# Patient Record
Sex: Female | Born: 1945 | Race: White | Hispanic: No | Marital: Single | Smoking: Former smoker
Health system: Southern US, Community
[De-identification: ages and names within clinical notes are randomized; demographics above are authoritative.]

## PROBLEM LIST (undated history)

## (undated) DIAGNOSIS — K760 Fatty (change of) liver, not elsewhere classified: Secondary | ICD-10-CM

## (undated) DIAGNOSIS — J45909 Unspecified asthma, uncomplicated: Secondary | ICD-10-CM

## (undated) DIAGNOSIS — G4733 Obstructive sleep apnea (adult) (pediatric): Secondary | ICD-10-CM

## (undated) DIAGNOSIS — E785 Hyperlipidemia, unspecified: Secondary | ICD-10-CM

## (undated) DIAGNOSIS — J479 Bronchiectasis, uncomplicated: Secondary | ICD-10-CM

## (undated) DIAGNOSIS — I10 Essential (primary) hypertension: Secondary | ICD-10-CM

## (undated) DIAGNOSIS — K5190 Ulcerative colitis, unspecified, without complications: Secondary | ICD-10-CM

## (undated) HISTORY — PX: ABDOMINAL PERINEAL BOWEL RESECTION: SHX1111

## (undated) HISTORY — PX: ABDOMINAL HERNIA REPAIR: SHX539

## (undated) HISTORY — DX: Fatty (change of) liver, not elsewhere classified: K76.0

## (undated) HISTORY — DX: Ulcerative colitis, unspecified, without complications: K51.90

## (undated) HISTORY — PX: TONSILLECTOMY: SUR1361

## (undated) HISTORY — DX: Essential (primary) hypertension: I10

## (undated) HISTORY — DX: Bronchiectasis, uncomplicated: J47.9

## (undated) HISTORY — PX: UTERINE FIBROID SURGERY: SHX826

## (undated) HISTORY — DX: Obstructive sleep apnea (adult) (pediatric): G47.33

## (undated) HISTORY — DX: Hyperlipidemia, unspecified: E78.5

## (undated) HISTORY — DX: Unspecified asthma, uncomplicated: J45.909

---

## 2007-03-04 DIAGNOSIS — K760 Fatty (change of) liver, not elsewhere classified: Secondary | ICD-10-CM | POA: Insufficient documentation

## 2009-06-19 DEATH — deceased

## 2009-10-19 DEATH — deceased

## 2014-07-09 DIAGNOSIS — Z8719 Personal history of other diseases of the digestive system: Secondary | ICD-10-CM | POA: Insufficient documentation

## 2014-07-09 DIAGNOSIS — M810 Age-related osteoporosis without current pathological fracture: Secondary | ICD-10-CM | POA: Insufficient documentation

## 2014-07-09 DIAGNOSIS — D751 Secondary polycythemia: Secondary | ICD-10-CM | POA: Insufficient documentation

## 2014-07-09 DIAGNOSIS — N200 Calculus of kidney: Secondary | ICD-10-CM | POA: Insufficient documentation

## 2014-07-09 DIAGNOSIS — F341 Dysthymic disorder: Secondary | ICD-10-CM | POA: Insufficient documentation

## 2014-07-09 DIAGNOSIS — G4733 Obstructive sleep apnea (adult) (pediatric): Secondary | ICD-10-CM | POA: Insufficient documentation

## 2014-07-09 DIAGNOSIS — E039 Hypothyroidism, unspecified: Secondary | ICD-10-CM | POA: Insufficient documentation

## 2014-07-09 DIAGNOSIS — M722 Plantar fascial fibromatosis: Secondary | ICD-10-CM | POA: Insufficient documentation

## 2014-07-24 DIAGNOSIS — J4520 Mild intermittent asthma, uncomplicated: Secondary | ICD-10-CM | POA: Insufficient documentation

## 2015-02-21 DIAGNOSIS — F1021 Alcohol dependence, in remission: Secondary | ICD-10-CM | POA: Insufficient documentation

## 2015-06-11 DIAGNOSIS — F419 Anxiety disorder, unspecified: Secondary | ICD-10-CM | POA: Insufficient documentation

## 2015-09-05 DIAGNOSIS — F1021 Alcohol dependence, in remission: Secondary | ICD-10-CM | POA: Insufficient documentation

## 2015-09-05 DIAGNOSIS — F900 Attention-deficit hyperactivity disorder, predominantly inattentive type: Secondary | ICD-10-CM | POA: Insufficient documentation

## 2016-11-26 DIAGNOSIS — G252 Other specified forms of tremor: Secondary | ICD-10-CM | POA: Insufficient documentation

## 2017-01-15 DIAGNOSIS — Z6841 Body Mass Index (BMI) 40.0 and over, adult: Secondary | ICD-10-CM | POA: Insufficient documentation

## 2017-07-28 DIAGNOSIS — H2513 Age-related nuclear cataract, bilateral: Secondary | ICD-10-CM | POA: Insufficient documentation

## 2017-07-28 DIAGNOSIS — H268 Other specified cataract: Secondary | ICD-10-CM | POA: Insufficient documentation

## 2017-09-21 DIAGNOSIS — K219 Gastro-esophageal reflux disease without esophagitis: Secondary | ICD-10-CM | POA: Insufficient documentation

## 2017-09-21 DIAGNOSIS — E7800 Pure hypercholesterolemia, unspecified: Secondary | ICD-10-CM | POA: Insufficient documentation

## 2018-03-11 ENCOUNTER — Encounter: Payer: Self-pay | Admitting: Internal Medicine

## 2018-03-11 ENCOUNTER — Ambulatory Visit (INDEPENDENT_AMBULATORY_CARE_PROVIDER_SITE_OTHER): Payer: Medicare Other | Admitting: Internal Medicine

## 2018-03-11 ENCOUNTER — Ambulatory Visit (INDEPENDENT_AMBULATORY_CARE_PROVIDER_SITE_OTHER)
Admission: RE | Admit: 2018-03-11 | Discharge: 2018-03-11 | Disposition: A | Discharge status: Home / Self Care | Payer: Medicare Other | Source: Ambulatory Visit | Attending: Internal Medicine | Admitting: Internal Medicine

## 2018-03-11 VITALS — BP 122/80 | HR 75 | Ht 59.0 in | Wt 193.0 lb

## 2018-03-11 DIAGNOSIS — J479 Bronchiectasis, uncomplicated: Secondary | ICD-10-CM

## 2018-03-11 MED ORDER — AMOXICILLIN-POT CLAVULANATE 875-125 MG PO TABS: 1.0000 | ORAL_TABLET | Freq: Two times a day (BID) | ORAL | 0 refills | Status: DC

## 2018-03-11 MED ORDER — AMOXICILLIN-POT CLAVULANATE 875-125 MG PO TABS: 1.0000 | ORAL_TABLET | Freq: Two times a day (BID) | ORAL | 0 refills | Status: AC

## 2018-03-11 MED ORDER — BUDESONIDE-FORMOTEROL FUMARATE 80-4.5 MCG/ACT IN AERO: 2.0000 | INHALATION_SPRAY | Freq: Two times a day (BID) | RESPIRATORY_TRACT | 11 refills | Status: DC

## 2018-03-11 NOTE — Assessment & Plan Note (Addendum)
03/11/2018  After extensive coaching inhaler device  effectiveness =    75% so try symbicort 80 2bid and stop fosfamax  DDX of  difficult airways management almost all start with A and  include Adherence, Ace Inhibitors, Acid Reflux, Active Sinus Disease, Alpha 1 Antitripsin deficiency, Anxiety masquerading as Airways dz,  ABPA,  Allergy(esp in young), Aspiration (esp in elderly), Adverse effects of meds,  Active smokers, A bunch of PE's (a small clot burden can't cause this syndrome unless there is already severe underlying pulm or vascular dz with poor reserve) plus two Bs  = Bronchiectasis and Beta blocker use..and one C= CHF   Adherence is always the initial "prime suspect" and is a multilayered concern that requires a "trust but verify" approach in every patient - starting with knowing how to use medications, especially inhalers, correctly, keeping up with refills and understanding the fundamental difference between maintenance and prns vs those medications only taken for a very short course and then stopped and not refilled.  - see hfa teaching  ? Active sinusitis > augmentin x 10 d and low threshold for f/u sinus ct in the setting of documented bronchiectasis   ? Acid (or non-acid) GERD > always difficult to exclude as up to 75% of pts in some series report no assoc GI/ Heartburn symptoms and she has active HB on fosfamax > rec max (24h)  acid suppression and diet restrictions/ reviewed and instructions given in writing.   ? Adverse effects of meds >  Stop fosfamax/ stop DPI (advair and replace with symb 802bid)  ? Allergy / immune def >  Use low dose symb for now, check immunos on return   ? Alpha one def > screen on return   Bronchiectasis vs pna L post basal segment > augmentin x 10 days and f/u cxr on return   ? CHF > note cm on cxr ? Cardiac asthma component > check bnp on return    Total time devoted to counseling  > 50 % of initial 60 min office visit:  review case with pt/  discussion of options/alternatives/ personally creating written customized instructions  in presence of pt  then going over those specific  Instructions directly with the pt including how to use all of the meds but in particular covering each new medication in detail and the difference between the maintenance= "automatic" meds and the prns using an action plan format for the latter (If this problem/symptom => do that organization reading Left to right).  Please see AVS from this visit for a full list of these instructions which I personally wrote for this pt and  are unique to this visit.   See device teaching which extended face to face time for this visit

## 2018-03-11 NOTE — Patient Instructions (Addendum)
Stop advair and alendronate for now  Augmentin 875 mg take one pill twice daily  X 10 days - take at breakfast and supper with large glass of water.  It would help reduce the usual side effects (diarrhea and yeast infections) if you ate cultured yogurt at lunch.   Symbicort 80 Take 2 puffs first thing in am and then another 2 puffs about 12 hours later.    Work on inhaler technique:  relax and gently blow all the way out then take a nice smooth deep breath back in, triggering the inhaler at same time you start breathing in.  Hold for up to 5 seconds if you can. Blow out thru nose. Rinse and gargle with water when done     Change Omeprazole to take  20 mg x 30- 60 min before your first and last meals of the day   GERD (REFLUX)  is an extremely common cause of respiratory symptoms just like yours , many times with no obvious heartburn at all.    It can be treated with medication, but also with lifestyle changes including elevation of the head of your bed (ideally with 6 inch  bed blocks),  Smoking cessation, avoidance of late meals, excessive alcohol, and avoid fatty foods, chocolate, peppermint, colas, red wine, and acidic juices such as orange juice.  NO MINT OR MENTHOL PRODUCTS SO NO COUGH DROPS   USE SUGARLESS CANDY INSTEAD (Jolley ranchers or Stover's or Life Savers) or even ice chips will also do - the key is to swallow to prevent all throat clearing. NO OIL BASED VITAMINS - use powdered substitutes.    For cough/ congestion  > mucinex or mucinex dm up  To 1200 mg every 12 hours as needed   Please schedule a follow up office visit in 2 weeks to see one our NPS, sooner if needed  with all medications /inhalers/ solutions in hand so we can verify exactly what you are taking. This includes all medications from all doctors and over the counters - set up for pfts on return to see me in 6 weeks from now  - also needs f/u cxr and Ig's and alpha one screen

## 2018-03-11 NOTE — Progress Notes (Signed)
Subjective:     Patient ID: Adriana White, female   DOB: [birth year],    MRN: [id number]  HPI  76 yowf  From [place] Quit smoking in 1986 when first and last baby born with baseline of 160 then around  Late  1990s  noted cough and eval by [facility] pulmonologist and eventually dx'd bronchiectasis placed  On advair seemed to help symtoms of sob/ cough initially but worse since around 12/2017 so selfreferred to pulmonary clinic 03/11/2018      03/11/2018 1st [facility] office visit/ Ashle Stief   Chief Complaint  Patient presents with  . Pulmonary Consult    Self referral. She states dxed with bronchiectasis approx 20 yrs ago. She c/o cough with tan sputum and increased SOB over the past 2 months. She states she gets SOB walking 1/2 a block.   pt with bronchiectasis worse cough x 2 months esp hs  With thick brown mucus    s assoc with nasal congestion and overt HB on fosfamax and ppi  but  no cp and never had hemoptysis with flares in past  Sob walking dog esp hills @ moderate pace   No obvious day to day or daytime variability or assoc  mucus plugs or   chest tightness, subjective wheeze  . No unusual exposure hx or h/o childhood pna/ asthma or knowledge of premature birth.    Also denies any obvious fluctuation of symptoms with weather or environmental changes or other aggravating or alleviating factors except as outlined above   Current Allergies, Complete Past Medical History, Past Surgical History, Family History, and Social History were reviewed in [employer] record.  ROS  The following are not active complaints unless bolded Hoarseness, sore throat, dysphagia, dental problems, itching, sneezing,  nasal congestion or discharge of excess mucus or purulent secretions, ear ache,   fever, chills, sweats, unintended wt loss or wt gain, classically pleuritic or exertional cp,  orthopnea pnd or arm/hand swelling  or leg swelling, presyncope, palpitations, abdominal pain,  anorexia, nausea, vomiting, diarrhea  or change in bowel habits or change in bladder habits, change in stools or change in urine, dysuria, hematuria,  rash, arthralgias, visual complaints, headache, numbness, weakness or ataxia or problems with walking or coordination,  change in mood or  memory.        Current Meds  Medication Sig  . ARIPiprazole (ABILIFY) 2 MG tablet Take 0.5 tablets by mouth daily.  Marland Kitchen aspirin 81 MG chewable tablet Chew 1 tablet by mouth daily.  Marland Kitchen atorvastatin (LIPITOR) 40 MG tablet Take 1 tablet by mouth daily.  . Biotin 1000 MCG tablet Take 1 tablet by mouth daily.  . cetirizine (ZYRTEC ALLERGY) 10 MG tablet Take 10 mg by mouth daily.  . clonazePAM (KLONOPIN) 0.5 MG tablet Take 1 tablet by mouth daily as needed.  . DULoxetine (CYMBALTA) 60 MG capsule Take 1 capsule by mouth daily.  Marland Kitchen levothyroxine (SYNTHROID, LEVOTHROID) 25 MCG tablet Take 1 tablet by mouth daily.  Marland Kitchen losartan (COZAAR) 25 MG tablet Take 1 tablet by mouth daily.  Marland Kitchen omeprazole (PRILOSEC) 20 MG capsule Take 2 tablets by mouth daily.  . traZODone (DESYREL) 100 MG tablet Take 1 tablet by mouth at bedtime.  Marland Kitchen   alendronate (FOSAMAX) 70 MG tablet Take 1 tablet by mouth daily. Pt states she was prescribed this to take daily  . [  Fluticasone-Salmeterol (ADVAIR) 100-50 MCG/DOSE AEPB Inhale 1 puff into the lungs 2 (two) times daily.  Review of Systems     Objective:   Physical Exam    amb wf rattling cough and hoarsness  Wt Readings from Last 3 Encounters:  03/11/18 193 lb (87.5 kg)     Vital signs reviewed - Note on arrival 02 sats  93% on on RA   HEENT: nl dentition, turbinates bilaterally, and oropharynx. Nl external ear canals without cough reflex   NECK :  without JVD/Nodes/TM/ nl carotid upstrokes bilaterally   LUNGS: no acc muscle use,  Nl contour chest with junky pan exp rhonchi  bilaterally with cough on exp maneuvers   CV:  RRR  no s3 or murmur or increase in P2, and no  edema   ABD:  soft and nontender with nl inspiratory excursion in the supine position. No bruits or organomegaly appreciated, bowel sounds nl  MS:  Nl gait/ ext warm without deformities, calf tenderness, cyanosis or clubbing No obvious joint restrictions   SKIN: warm and dry without lesions    NEURO:  alert, approp, nl sensorium with  no motor or cerebellar deficits apparent.          CXR PA and Lateral:   03/11/2018 :    I personally reviewed images and agree with radiology impression as follows:   Posterior left basilar patchy airspace disease. Followup PA and lateral chest X-ray is recommended in 3-4 weeks following trial of antibiotic therapy to ensure resolution and exclude underlying malignancy.  Cardiomegaly without decompensation       Assessment:

## 2018-03-12 ENCOUNTER — Encounter: Payer: Self-pay | Admitting: Internal Medicine

## 2018-03-15 NOTE — Progress Notes (Signed)
Spoke with pt and notified of results per Dr. Wert. Pt verbalized understanding and denied any questions. 

## 2018-03-16 ENCOUNTER — Telehealth: Payer: Self-pay | Admitting: Internal Medicine

## 2018-03-16 NOTE — Telephone Encounter (Signed)
Spoke with patient. She was concerned about babysitting since she was diagnosed with PNA last week. She has been on antibiotics since 5/24. She stated that she currently feels well and was able to babysit yesterday with no problems.   Advised patient that the babysitting would be up to her as long as she felt ok to do so. She verbalized understanding. Nothing else needed at time of call.

## 2018-03-24 ENCOUNTER — Ambulatory Visit: Payer: Medicare Other | Admitting: Adult Health

## 2018-03-31 ENCOUNTER — Ambulatory Visit (INDEPENDENT_AMBULATORY_CARE_PROVIDER_SITE_OTHER): Payer: Medicare Other | Admitting: Pulmonary Disease

## 2018-03-31 ENCOUNTER — Ambulatory Visit (INDEPENDENT_AMBULATORY_CARE_PROVIDER_SITE_OTHER)
Admission: RE | Admit: 2018-03-31 | Discharge: 2018-03-31 | Disposition: A | Discharge status: Home / Self Care | Payer: Medicare Other | Source: Ambulatory Visit | Attending: Pulmonary Disease | Admitting: Pulmonary Disease

## 2018-03-31 ENCOUNTER — Other Ambulatory Visit: Payer: Self-pay | Admitting: Pulmonary Disease

## 2018-03-31 ENCOUNTER — Encounter: Payer: Self-pay | Admitting: Pulmonary Disease

## 2018-03-31 VITALS — BP 116/80 | HR 91 | Ht 59.0 in | Wt 189.8 lb

## 2018-03-31 DIAGNOSIS — J479 Bronchiectasis, uncomplicated: Secondary | ICD-10-CM | POA: Diagnosis not present

## 2018-03-31 DIAGNOSIS — B370 Candidal stomatitis: Secondary | ICD-10-CM | POA: Diagnosis not present

## 2018-03-31 MED ORDER — FLUCONAZOLE 100 MG PO TABS: 100.0000 mg | ORAL_TABLET | Freq: Every day | ORAL | 0 refills | Status: AC

## 2018-03-31 MED ORDER — FLUTTER DEVI: 0 refills | Status: AC

## 2018-03-31 NOTE — Progress Notes (Signed)
Discussed with patient at office visit today.   Wyn Quaker FNP

## 2018-03-31 NOTE — Progress Notes (Signed)
@Patient  ID: Adriana White, female    DOB: Mar 29, 1946, 72 y.o.   MRN: [id number]  Chief Complaint  Patient presents with  . Follow-up    States she feels better no futher concerns voiced.     Referring provider: Derinda Sis, *  HPI:  42 yowf  From [place] Quit smoking in 1986 when first and last baby born with baseline of 160 then around  Late  1990s  noted cough and eval by [facility] pulmonologist and eventually dx'd bronchiectasis placed  On advair seemed to help symtoms of sob/ cough initially but worse since around 12/2017 so selfreferred to pulmonary clinic 03/11/2018     Recent [facility] Encounters:   03/11/2018 1st [facility] office visit/ Wert       Chief Complaint  Patient presents with  . Pulmonary Consult    Self referral. She states dxed with bronchiectasis approx 20 yrs ago. She c/o cough with tan sputum and increased SOB over the past 2 months. She states she gets SOB walking 1/2 a block.   pt with bronchiectasis worse cough x 2 months esp hs  With thick brown mucus    s assoc with nasal congestion and overt HB on fosfamax and ppi  but  no cp and never had hemoptysis with flares in past  Sob walking dog esp hills @ moderate pace   No obvious day to day or daytime variability or assoc  mucus plugs or   chest tightness, subjective wheeze  . No unusual exposure hx or h/o childhood pna/ asthma or knowledge of premature birth.     Tests:   Imaging:  03/11/2018-chest x-ray- posterior left basilar patchy airspace disease, follow-up chest x-ray in 3 to 4 weeks following antibiotic therapy  Cardiac:   Labs:   Micro:   Chart Review:    03/31/18 OV  Pleasant 72 year old patient presenting today for follow-up visit.  Patient's chest x-ray today shows slight improvement in that left lower lobe patchy airspace disease.  After Augmentin.  Patient reporting that she feels very good not having any complaints.  No fevers.  And clinically feels  that she is improved.   Patient reporting adherence to Symbicort 80.  Patient will follow-up with [name] in July/2019 for office visit as well as pulmonary function testing.   Allergies  Allergen Reactions  . Olsalazine Rash    Other reaction(s): Other (See Comments) unknown Pt. Not sure of reaction Pt. Not sure of reaction   . Sulfamethoxazole Rash    Patient not sure why. Patient not sure why.     Immunization History  Administered Date(s) Administered  . Influenza, High Dose Seasonal PF 07/08/2017    Past Medical History:  Diagnosis Date  . Hyperlipidemia   . Hypertension   . OSA (obstructive sleep apnea)     Tobacco History: Social History   Tobacco Use  Smoking Status Former Smoker  . Packs/day: 1.50  . Years: 17.00  . Pack years: 25.50  . Types: Cigarettes  . Last attempt to quit: 10/19/1984  . Years since quitting: 33.4  Smokeless Tobacco Never Used   Counseling given: Yes Continue to not smoke.  Outpatient Encounter Medications as of 03/31/2018  Medication Sig  . ARIPiprazole (ABILIFY) 2 MG tablet Take 0.5 tablets by mouth daily.  Marland Kitchen aspirin 81 MG chewable tablet Chew 1 tablet by mouth daily.  Marland Kitchen atorvastatin (LIPITOR) 40 MG tablet Take 1 tablet by mouth daily.  . Biotin 1000 MCG tablet Take 1  tablet by mouth daily.  . budesonide-formoterol (SYMBICORT) 80-4.5 MCG/ACT inhaler Inhale 2 puffs into the lungs 2 (two) times daily.  . cetirizine (ZYRTEC ALLERGY) 10 MG tablet Take 10 mg by mouth daily.  . clonazePAM (KLONOPIN) 0.5 MG tablet Take 1 tablet by mouth daily as needed.  . DULoxetine (CYMBALTA) 60 MG capsule Take 1 capsule by mouth daily.  Marland Kitchen. levothyroxine (SYNTHROID, LEVOTHROID) 25 MCG tablet Take 1 tablet by mouth daily.  Marland Kitchen. losartan (COZAAR) 25 MG tablet Take 1 tablet by mouth daily.  Marland Kitchen. omeprazole (PRILOSEC) 20 MG capsule Take 2 tablets by mouth daily.  . traZODone (DESYREL) 100 MG tablet Take 1 tablet by mouth at bedtime.  . fluconazole  (DIFLUCAN) 100 MG tablet Take 1 tablet (100 mg total) by mouth daily for 3 days.  Marland Kitchen. Respiratory Therapy Supplies (FLUTTER) DEVI Use as directed   No facility-administered encounter medications on file as of 03/31/2018.      Review of Systems  Constitutional:   No  weight loss, night sweats,  fevers, chills, fatigue, or  lassitude HEENT:   No headaches,  Difficulty swallowing,  Tooth/dental problems, or  Sore throat, No sneezing, itching, ear ache, nasal congestion, post nasal drip  CV: No chest pain,  orthopnea, PND, swelling in lower extremities, anasarca, dizziness, palpitations, syncope  GI: No heartburn, indigestion, abdominal pain, nausea, vomiting, diarrhea, change in bowel habits, loss of appetite, bloody stools Resp: No shortness of breath with exertion or at rest.  No excess mucus, no productive cough,  No non-productive cough,  No coughing up of blood.  No change in color of mucus.  No wheezing.  No chest wall deformity Skin: no rash, lesions, no skin changes. GU: no dysuria, change in color of urine, no urgency or frequency.  No flank pain, no hematuria  MS:  No joint pain or swelling.  No decreased range of motion.  No back pain. Psych:  No change in mood or affect. No depression or anxiety.  No memory loss.   Physical Exam  BP 116/80   Pulse 91   Ht 4\' 11"  (1.499 m)   Wt 189 lb 12.8 oz (86.1 kg)   SpO2 95%   BMI 38.33 kg/m    Wt Readings from Last 3 Encounters:  03/31/18 189 lb 12.8 oz (86.1 kg)  03/11/18 193 lb (87.5 kg)     GEN: A/Ox3; pleasant , NAD, well nourished    HEENT:  [place]/AT,  EACs- right canal completely occluded with cerumen, TMs- unable to visualize R TM on exam d/t cerumen, L TM without infection, NOSE-clear, THROAT- +oropharyngeal thrush    NECK:  Supple w/ fair ROM; no JVD; normal carotid impulses w/o bruits; no thyromegaly or nodules palpated; no lymphadenopathy.    RESP:  Clear  P & A with good air movement throughout exam; w/o, wheezes/ rales/  or rhonchi. no accessory muscle use, no dullness to percussion  CARD:  RRR, no m/r/g, no peripheral edema, pulses intact, no cyanosis or clubbing.  GI:   Soft & nt; nml bowel sounds; no organomegaly or masses detected.   Musco: Warm bil, no deformities or joint swelling noted.   Neuro: alert, no focal deficits noted.    Skin: Warm, no lesions or rashes    Lab Results:  CBC No results found for: WBC, RBC, HGB, HCT, PLT, MCV, MCH, MCHC, RDW, LYMPHSABS, MONOABS, EOSABS, BASOSABS  BMET No results found for: NA, K, CL, CO2, GLUCOSE, BUN, CREATININE, CALCIUM, GFRNONAA, GFRAA  BNP No  results found for: BNP  ProBNP No results found for: PROBNP  Imaging: Dg Chest 2 View  Result Date: 03/31/2018 CLINICAL DATA:  History of pneumonia, follow-up, former smoking history EXAM: CHEST - 2 VIEW COMPARISON:  Chest x-ray of 03/11/2017 FINDINGS: Prominent markings remain at the left lung base most consistent with atelectasis or scarring. Pneumonia would be difficult to exclude. No pleural effusion is seen. The right lung is clear. Mediastinal and hilar contours are unremarkable. The heart is mildly enlarged and stable. There are mild degenerative changes in the mid to lower thoracic spine. IMPRESSION: 1. Prominent markings again are noted at the left lung base most consistent with atelectasis or scarring. Pneumonia cannot be excluded. 2. Stable borderline cardiomegaly. Electronically Signed   By: Dwyane Dee M.D.   On: 03/31/2018 12:22   Dg Chest 2 View  Result Date: 03/11/2018 CLINICAL DATA:  Productive cough.  Short of breath. EXAM: CHEST - 2 VIEW COMPARISON:  None. FINDINGS: The heart is moderately enlarged. Diffuse interstitial prominence. No Kerley B lines. Patchy airspace disease at the posterior left lung base. Subsegmental atelectasis at the right lung base. Upper lungs clear. No pneumothorax or pleural effusion. Normal vascularity. No pneumothorax or pleural effusion. IMPRESSION: Posterior  left basilar patchy airspace disease. Followup PA and lateral chest X-ray is recommended in 3-4 weeks following trial of antibiotic therapy to ensure resolution and exclude underlying malignancy. Cardiomegaly without decompensation Electronically Signed   By: Jolaine Click M.D.   On: 03/11/2018 15:39     Assessment & Plan:   Pleasant 72 year old patient seen today for repeat chest x-ray to ensure left lower lobe patchy infiltrate pneumonia as result is resolving.  Slight improvement seen on chest x-ray.  Based on clinical picture and improvement on chest x-ray no further antibiotic therapy is needed at this time my opinion.  We will have patient follow back up with [name] as scheduled for pulmonary function testing next month.  Emphasized the importance of rinsing mouth out after using Symbicort as patient has developed oropharyngeal thrush.  Will treat today with Diflucan.   Also discussed importance of flutter valve use.  Patient says that he had one in the past and used to use it but have lost their's.  Will replace it today.  Obstructive bronchiectasis (HCC) PFTs on 7/17 Flutter valve provided today >>> Use at least 2 times a day and can go up to 4-6 times a day when not feeling well Maintain good pulmonary hygiene as well as eat healthy and nutritious meals and hydrate well  Thrush Probably due to patient not rinsing mouth out after using Symbicort  Patient to start rinsing mouth out when using Symbicort Diflucan today to treat     [name], NP 03/31/2018

## 2018-03-31 NOTE — Patient Instructions (Addendum)
Keep follow-up with Dr. Sherene SiresWert on 7/17 Complete pulmonary function testing before that appointment on the same day Continue Symbicort >>> Remember to rinse mouth out after use as this will decrease her chance of getting thrush Diflucan today >>> Take 1 100mg  tablet daily for 3 days   Follow-up with our office if you have any concerns about your breathing or that you feel your breathing is worsening.  Please contact the office if your symptoms worsen or you have concerns that you are not improving.   Thank you for choosing [facility] for your healthcare, and for allowing us to partner with you on your healthcare journey. I am thankful to be able to provide care to you today.   Elisha HeadlandBrian Oswin Johal FNP-C

## 2018-03-31 NOTE — Assessment & Plan Note (Signed)
PFTs on 7/17 Flutter valve provided today >>> Use at least 2 times a day and can go up to 4-6 times a day when not feeling well Maintain good pulmonary hygiene as well as eat healthy and nutritious meals and hydrate well

## 2018-03-31 NOTE — Progress Notes (Signed)
Chart and office note reviewed in detail  > agree with a/p as outlined    

## 2018-03-31 NOTE — Assessment & Plan Note (Signed)
Probably due to patient not rinsing mouth out after using Symbicort  Patient to start rinsing mouth out when using Symbicort Diflucan today to treat

## 2018-04-07 DIAGNOSIS — R197 Diarrhea, unspecified: Secondary | ICD-10-CM | POA: Insufficient documentation

## 2018-05-04 ENCOUNTER — Encounter: Payer: Self-pay | Admitting: Internal Medicine

## 2018-05-04 ENCOUNTER — Ambulatory Visit: Payer: Medicare Other | Admitting: Internal Medicine

## 2018-05-04 ENCOUNTER — Ambulatory Visit (INDEPENDENT_AMBULATORY_CARE_PROVIDER_SITE_OTHER): Payer: Medicare Other | Admitting: Internal Medicine

## 2018-05-04 VITALS — BP 128/80 | HR 104 | Temp 97.7°F | Ht 59.0 in | Wt 183.6 lb

## 2018-05-04 DIAGNOSIS — R059 Cough, unspecified: Secondary | ICD-10-CM

## 2018-05-04 DIAGNOSIS — R05 Cough: Secondary | ICD-10-CM

## 2018-05-04 DIAGNOSIS — J479 Bronchiectasis, uncomplicated: Secondary | ICD-10-CM | POA: Diagnosis not present

## 2018-05-04 NOTE — Patient Instructions (Signed)
Symbicort 80 Take 2 puffs first thing in am and then another 2 puffs about 12 hours later.    Work on inhaler technique:  relax and gently blow all the way out then take a nice smooth deep breath back in, triggering the inhaler at same time you start breathing in.  Hold for up to 5 seconds if you can. Blow out thru nose. Rinse and gargle with water when done     Change Omeprazole to take  20 mg x 30- 60 min before your first and last meals of the day   GERD (REFLUX)  is an extremely common cause of respiratory symptoms just like yours , many times with no obvious heartburn at all.    It can be treated with medication, but also with lifestyle changes including elevation of the head of your bed (ideally with 6 inch  bed blocks),  Smoking cessation, avoidance of late meals, excessive alcohol, and avoid fatty foods, chocolate, peppermint, colas, red wine, and acidic juices such as orange juice.  NO MINT OR MENTHOL PRODUCTS SO NO COUGH DROPS   USE SUGARLESS CANDY INSTEAD (Jolley ranchers or Stover's or Life Savers) or even ice chips will also do - the key is to swallow to prevent all throat clearing. NO OIL BASED VITAMINS - use powdered substitutes.    For cough/ congestion  > mucinex or mucinex dm up  To 1200 mg every 12 hours as needed  Please see patient coordinator before you leave today  to schedule sinus CT    Please schedule a follow up office visit in 2 weeks to see one our NPS, sooner if needed  with all medications /inhalers/ solutions in hand so we can verify exactly what you are taking. This includes all medications from all doctors and over the counters - set up for pfts on return to see me in 4  weeks from now

## 2018-05-04 NOTE — Progress Notes (Signed)
Subjective:     Patient ID: Adriana White, female   DOB: [birth year],    MRN: [id number]   Brief patient profile:  88 yowf  From [place] Quit smoking in 1986 when first and last baby born with baseline of 160 then around  Late  1990s  noted cough and eval by [facility] pulmonologist and eventually dx'd bronchiectasis placed  On advair seemed to help symtoms of sob/ cough initially but worse since around 12/2017 so self referred to pulmonary clinic 03/11/2018      History of Present Illness  03/11/2018 1st [facility] office visit/ Kacie Huxtable   Chief Complaint  Patient presents with  . Pulmonary Consult    Self referral. She states dxed with bronchiectasis approx 20 yrs ago. She c/o cough with tan sputum and increased SOB over the past 2 months. She states she gets SOB walking 1/2 a block.   pt with bronchiectasis worse cough x 2 months esp hs  With thick brown mucus    s assoc with nasal congestion and overt HB on fosfamax and ppi  but  no cp and never had hemoptysis with flares in past  Sob walking dog esp hills @ moderate pace  rec Stop advair and alendronate for now Augmentin 875 mg take one pill twice daily  X 10 days - take at breakfast and supper with large glass of water.  It would help reduce the usual side effects (diarrhea and yeast infections) if you ate cultured yogurt at lunch.  Symbicort 80 Take 2 puffs first thing in am and then another 2 puffs about 12 hours later.  Work on inhaler technique:  relax and gently blow all the way out then take a nice smooth deep breath back in, triggering the inhaler at same time you start breathing in.  Hold for up to 5 seconds if you can. Blow out thru nose. Rinse and gargle with water when done Change Omeprazole to take  20 mg x 30- 60 min before your first and last meals of the day  GERD diet  For cough/ congestion  > mucinex or mucinex dm up  To 1200 mg every 12 hours as needed Please schedule a follow up office visit in 2 weeks to see one our NPS,  sooner if needed  with all medications /inhalers/ solutions in hand so we can verify exactly what you are taking. This includes all medications from all doctors and over the counters - set up for pfts on return to see me in 6 weeks from now  - also needs f/u cxr and Ig's and alpha one screen      05/04/2018  f/u ov/Arnelle Nale re: bronchiectasis  / did not bring meds / did not schedule pfts  Chief Complaint  Patient presents with  . Acute Visit    Pt c/o "stuffed up" and prod cough with tan sputum 6 days.     breathing is better, able to walk dog easier but cough no better and more brown mucus x one week prior to OV   Overusing saba   No obvious day to day or daytime variability or assoc   mucus plugs or hemoptysis or cp or chest tightness, subjective wheeze or overt sinus or hb symptoms.     Also denies any obvious fluctuation of symptoms with weather or environmental changes or other aggravating or alleviating factors except as outlined above   No unusual exposure hx or h/o childhood pna/ asthma or knowledge of premature birth.  Current  Allergies, Complete Past Medical History, Past Surgical History, Family History, and Social History were reviewed in [employer] record.  ROS  The following are not active complaints unless bolded Hoarseness, sore throat, dysphagia, dental problems, itching, sneezing,  nasal congestion or discharge of excess mucus or purulent secretions, ear ache,   fever, chills, sweats, unintended wt loss or wt gain, classically pleuritic or exertional cp,  orthopnea pnd or arm/hand swelling  or leg swelling, presyncope, palpitations, abdominal pain, anorexia, nausea, vomiting, diarrhea  or change in bowel habits or change in bladder habits, change in stools or change in urine, dysuria, hematuria,  rash, arthralgias, visual complaints, headache, numbness, weakness or ataxia or problems with walking or coordination,  change in mood or  memory.         Current Meds - not able to verify any of these meds/ very shaky on details   Medication Sig  . ARIPiprazole (ABILIFY) 2 MG tablet Take 0.5 tablets by mouth daily.  Marland Kitchen aspirin 81 MG chewable tablet Chew 1 tablet by mouth daily.  Marland Kitchen atorvastatin (LIPITOR) 40 MG tablet Take 1 tablet by mouth daily.  . Biotin 1000 MCG tablet Take 1 tablet by mouth daily.  . budesonide-formoterol (SYMBICORT) 80-4.5 MCG/ACT inhaler Inhale 2 puffs into the lungs 2 (two) times daily.  . cetirizine (ZYRTEC ALLERGY) 10 MG tablet Take 10 mg by mouth daily.  . clonazePAM (KLONOPIN) 0.5 MG tablet Take 1 tablet by mouth daily as needed.  . DULoxetine (CYMBALTA) 60 MG capsule Take 1 capsule by mouth daily.  Marland Kitchen levothyroxine (SYNTHROID, LEVOTHROID) 25 MCG tablet Take 1 tablet by mouth daily.  Marland Kitchen losartan (COZAAR) 25 MG tablet Take 1 tablet by mouth daily.  Marland Kitchen omeprazole (PRILOSEC) 20 MG capsule Take 2 tablets by mouth daily.  Marland Kitchen Respiratory Therapy Supplies (FLUTTER) DEVI Use as directed  . traZODone (DESYREL) 100 MG tablet Take 1 tablet by mouth at bedtime.            Objective:   Physical Exam  amb wf nad / nasal tone to voice     Wt Readings from Last 3 Encounters:  05/04/18 183 lb 9.6 oz (83.3 kg)  03/31/18 189 lb 12.8 oz (86.1 kg)  03/11/18 193 lb (87.5 kg)     Vital signs reviewed - Note on arrival 02 sats  93% on RA        HEENT: nl dentition, turbinates bilaterally, and oropharynx. Nl external ear canals without cough reflex   NECK :  without JVD/Nodes/TM/ nl carotid upstrokes bilaterally   LUNGS: no acc muscle use,  Nl contour chest with coarse bilateral exp > insp rhonchi and cough near end expiration    CV:  RRR  no s3 or murmur or increase in P2, and no edema   ABD:  soft and nontender with nl inspiratory excursion in the supine position. No bruits or organomegaly appreciated, bowel sounds nl  MS:  Nl gait/ ext warm without deformities, calf tenderness, cyanosis or clubbing No obvious joint  restrictions   SKIN: warm and dry without lesions    NEURO:  alert, approp, nl sensorium with  no motor or cerebellar deficits apparent.                    Assessment:

## 2018-05-05 ENCOUNTER — Encounter: Payer: Self-pay | Admitting: Internal Medicine

## 2018-05-05 DIAGNOSIS — R05 Cough: Secondary | ICD-10-CM | POA: Insufficient documentation

## 2018-05-05 DIAGNOSIS — R059 Cough, unspecified: Secondary | ICD-10-CM | POA: Insufficient documentation

## 2018-05-05 NOTE — Assessment & Plan Note (Addendum)
Allergy profile 05/04/2018 >  Eos 0. /  IgE pending Sinus CT ordered   Suspect she has sinusitis/ bronchiectasis which may be related to GERD so plan is to max rx gerd/encourage use of mucinex dm max doses/ uses flutter but return with it to verify approp use while awaiting  eval for sinus dz and f/u with pfts - see avs for instructions unique to this ov

## 2018-05-05 NOTE — Assessment & Plan Note (Signed)
03/11/2018    try symbicort 80 2bid and stop fosfamax - Quant Ig's 05/04/2018  - Alpha One AT screen  05/04/2018   - 05/04/2018  After extensive coaching inhaler device  effectiveness =    75% from baseline of 25%  > continue symb 80 2bid   In this case Adherence is the biggest issue and starts with  inability to use HFA effectively and also  understand that SABA treats the symptoms but doesn't get to the underlying problem (inflammation).  I used  the analogy of putting steroid cream on a rash to help explain the meaning of topical therapy and the need to get the drug to the target tissue  Will try again for max gerd rx/ return with all meds in hand using a trust but verify approach to confirm accurate Medication  Reconciliation The principal here is that until we are certain that the  patients are doing what we've asked, it makes no sense to ask them to do more.     I had an extended discussion with the patient reviewing all relevant studies completed to date and  lasting 15 to 20 minutes of a 25 minute visit    See device teaching which extended face to face time for this visit.  Each maintenance medication was reviewed in detail including emphasizing most importantly the difference between maintenance and prns and under what circumstances the prns are to be triggered using an action plan format that is not reflected in the computer generated alphabetically organized AVS which I have not found useful in most complex patients, especially with respiratory illnesses  Please see AVS for specific instructions unique to this visit that I personally wrote and verbalized to the the pt in detail and then reviewed with pt  by my nurse highlighting any  changes in therapy recommended at today's visit to their plan of care.

## 2018-05-06 ENCOUNTER — Other Ambulatory Visit: Payer: Medicare Other

## 2018-05-19 ENCOUNTER — Other Ambulatory Visit: Payer: Self-pay | Admitting: Adult Health

## 2018-05-19 ENCOUNTER — Encounter: Payer: Self-pay | Admitting: Adult Health

## 2018-05-19 ENCOUNTER — Encounter: Payer: Medicare Other | Admitting: Adult Health

## 2018-05-19 ENCOUNTER — Ambulatory Visit (INDEPENDENT_AMBULATORY_CARE_PROVIDER_SITE_OTHER): Payer: Medicare Other | Admitting: Adult Health

## 2018-05-19 VITALS — BP 140/80 | HR 88 | Ht 60.0 in | Wt 187.1 lb

## 2018-05-19 DIAGNOSIS — J479 Bronchiectasis, uncomplicated: Secondary | ICD-10-CM | POA: Diagnosis not present

## 2018-05-19 DIAGNOSIS — J471 Bronchiectasis with (acute) exacerbation: Secondary | ICD-10-CM | POA: Diagnosis not present

## 2018-05-19 DIAGNOSIS — R05 Cough: Secondary | ICD-10-CM | POA: Diagnosis not present

## 2018-05-19 DIAGNOSIS — R059 Cough, unspecified: Secondary | ICD-10-CM

## 2018-05-19 NOTE — Assessment & Plan Note (Signed)
Patient carries a diagnosis of bronchiectasis.  Spirometry today shows moderate airflow obstruction.  Patient is continue on Symbicort.  Continue on her flutter valve.  CT sinus and chest are pending.  Patient will have full PFTs later this month as planned  Plan  Patient Instructions  Continue on Symbicort 2 puffs Twice daily  , rinse after use .  Increase Flutter valve Three times a day   Begin Saline nasal rinses Twice daily  .  Go for CT sinus .  Set up for HRCT Chest .  Labs today .  Follow up with Dr. Melvyn Novas  This month as planned and  As needed   Please contact office for sooner follow up if symptoms do not improve or worsen or seek emergency care

## 2018-05-19 NOTE — Progress Notes (Signed)
@Patient  ID: Adriana White, female    DOB: 1946-02-19, 72 y.o.   MRN: [id number]  Chief Complaint  Patient presents with  . Follow-up    Bronchiectasis     Referring provider: Perrin Smack, *  HPI: 72 year old female former smoker seen for pulmonary consult Mar 11, 2018 for bronchiectasis. Previously from [place] and seen by pulmonologist diagnosed with bronchiectasis in the late 1990s.  05/19/2018 Follow up : Bronciectasis  Patient presents for a 6-week follow-up.  Patient was seen last visit with ongoing cough and congestion with thick mucus.  Patient was set up for a CT sinus.  Also lab work was set up as well.  Unfortunately patient misunderstood instructions and has not had these completed.  Patient complains that she has a daily cough that is very thick mucus at times.  She gets short of breath intermittently.  Has sinus drainage especially postnasal drip.  She denies any hemoptysis chest pain orthopnea PND leg swelling or weight loss. Chest x-ray March 31, 2018 showed prominent markings along the left base  Spirometry today shows moderate airflow obstruction with FEV1 at 73%, ratio 65, FVC 85%.  Allergies  Allergen Reactions  . Olsalazine Rash    Other reaction(s): Other (See Comments) unknown Pt. Not sure of reaction Pt. Not sure of reaction   . Sulfamethoxazole Rash    Patient not sure why. Patient not sure why.     Immunization History  Administered Date(s) Administered  . Influenza, High Dose Seasonal PF 07/08/2017    Past Medical History:  Diagnosis Date  . Hyperlipidemia   . Hypertension   . OSA (obstructive sleep apnea)     Tobacco History: Social History   Tobacco Use  Smoking Status Former Smoker  . Packs/day: 1.50  . Years: 17.00  . Pack years: 25.50  . Types: Cigarettes  . Last attempt to quit: 10/19/1984  . Years since quitting: 33.6  Smokeless Tobacco Never Used   Counseling given: Not Answered   Outpatient Medications Prior to  Visit  Medication Sig Dispense Refill  . ARIPiprazole (ABILIFY) 2 MG tablet Take 0.5 tablets by mouth daily.    Marland Kitchen aspirin 81 MG chewable tablet Chew 1 tablet by mouth daily.    Marland Kitchen atorvastatin (LIPITOR) 40 MG tablet Take 1 tablet by mouth daily.    . Biotin 1000 MCG tablet Take 1 tablet by mouth daily.    . budesonide-formoterol (SYMBICORT) 80-4.5 MCG/ACT inhaler Inhale 2 puffs into the lungs 2 (two) times daily. 1 Inhaler 11  . cetirizine (ZYRTEC ALLERGY) 10 MG tablet Take 10 mg by mouth daily.    . clonazePAM (KLONOPIN) 0.5 MG tablet Take 1 tablet by mouth daily as needed.    . DULoxetine (CYMBALTA) 60 MG capsule Take 1 capsule by mouth daily.    Marland Kitchen levothyroxine (SYNTHROID, LEVOTHROID) 25 MCG tablet Take 1 tablet by mouth daily.    Marland Kitchen losartan (COZAAR) 25 MG tablet Take 1 tablet by mouth daily.    Marland Kitchen omeprazole (PRILOSEC) 20 MG capsule Take 2 tablets by mouth daily.    Marland Kitchen Respiratory Therapy Supplies (FLUTTER) DEVI Use as directed 1 each 0  . traZODone (DESYREL) 100 MG tablet Take 1 tablet by mouth at bedtime.     No facility-administered medications prior to visit.      Review of Systems  Constitutional:   No  weight loss, night sweats,  Fevers, chills,  +fatigue, or  lassitude.  HEENT:   No headaches,  Difficulty swallowing,  Tooth/dental  problems, or  Sore throat,                No sneezing, itching, ear ache,  +nasal congestion, post nasal drip,   CV:  No chest pain,  Orthopnea, PND, swelling in lower extremities, anasarca, dizziness, palpitations, syncope.   GI  No heartburn, indigestion, abdominal pain, nausea, vomiting, diarrhea, change in bowel habits, loss of appetite, bloody stools.   Resp:   No chest wall deformity  Skin: no rash or lesions.  GU: no dysuria, change in color of urine, no urgency or frequency.  No flank pain, no hematuria   MS:  No joint pain or swelling.  No decreased range of motion.  No back pain.    Physical Exam  BP 140/80 (BP Location: Left  Arm, Cuff Size: Normal)   Pulse 88   Ht 5' (1.524 m)   Wt 187 lb 1.3 oz (84.9 kg)   SpO2 93%   BMI 36.54 kg/m   GEN: A/Ox3; pleasant , NAD, obese    HEENT:  [place]/AT,  EACs-clear, TMs-wnl, NOSE-clear drainage  THROAT-clear, no lesions, no postnasal drip or exudate noted.   NECK:  Supple w/ fair ROM; no JVD; normal carotid impulses w/o bruits; no thyromegaly or nodules palpated; no lymphadenopathy.    RESP  Few trace rhonchi  no accessory muscle use, no dullness to percussion  CARD:  RRR, no m/r/g, no peripheral edema, pulses intact, no cyanosis or clubbing.  GI:   Soft & nt; nml bowel sounds; no organomegaly or masses detected.   Musco: Warm bil, no deformities or joint swelling noted.   Neuro: alert, no focal deficits noted.    Skin: Warm, no lesions or rashes    Lab Results:  CBC No results found for: WBC, RBC, HGB, HCT, PLT, MCV, MCH, MCHC, RDW, LYMPHSABS, MONOABS, EOSABS, BASOSABS  BMET No results found for: NA, K, CL, CO2, GLUCOSE, BUN, CREATININE, CALCIUM, GFRNONAA, GFRAA  BNP No results found for: BNP  ProBNP No results found for: PROBNP  Imaging: No results found.   Assessment & Plan:   Obstructive bronchiectasis (Jack) Patient carries a diagnosis of bronchiectasis.  Spirometry today shows moderate airflow obstruction.  Patient is continue on Symbicort.  Continue on her flutter valve.  CT sinus and chest are pending.  Patient will have full PFTs later this month as planned  Plan  Patient Instructions  Continue on Symbicort 2 puffs Twice daily  , rinse after use .  Increase Flutter valve Three times a day   Begin Saline nasal rinses Twice daily  .  Go for CT sinus .  Set up for HRCT Chest .  Labs today .  Follow up with Dr. Melvyn Novas  This month as planned and  As needed   Please contact office for sooner follow up if symptoms do not improve or worsen or seek emergency care          Rexene Edison, NP 05/19/2018

## 2018-05-19 NOTE — Patient Instructions (Addendum)
Continue on Symbicort 2 puffs Twice daily  , rinse after use .  Increase Flutter valve Three times a day   Begin Saline nasal rinses Twice daily  .  Go for CT sinus .  Set up for HRCT Chest .  Labs today .  Follow up with Dr. Melvyn Novas  This month as planned and  As needed   Please contact office for sooner follow up if symptoms do not improve or worsen or seek emergency care

## 2018-05-20 NOTE — Addendum Note (Signed)
Addended by: Della Goo C on: 05/20/2018 08:58 AM   Modules accepted: Orders

## 2018-05-22 LAB — ALLERGENS W/TOTAL IGE AREA 2
Bermuda Grass IgE: <0.1 kU/L
Cladosporium Herbarum IgE: <0.1 kU/L
Cottonwood IgE: 0.12 kU/L — AB
D Pteronyssinus IgE: 0.15 kU/L — AB
D002-IGE D FARINAE: 0.14 kU/L — AB
E005-IGE DOG DANDER: 0.42 kU/L — AB
Elm, American IgE: 0.11 kU/L — AB
IgE (Immunoglobulin E), Serum: 159 IU/mL (ref 6–495)
Johnson Grass IgE: 0.1 kU/L — AB
M003-IGE ASPERGILLUS FUMIGATUS: 0.15 kU/L — AB
Maple/Box Elder IgE: <0.1 kU/L
Pecan, Hickory IgE: <0.1 kU/L
Penicillium Chrysogen IgE: <0.1 kU/L
Pigweed, Rough IgE: 0.12 kU/L — AB
Sheep Sorrel IgE Qn: <0.1 kU/L
T007-IGE OAK, WHITE: 0.12 kU/L — AB
Timothy Grass IgE: <0.1 kU/L
W001-IGE RAGWEED, SHORT: 1.2 kU/L — AB
White Mulberry IgE: <0.1 kU/L

## 2018-05-23 LAB — CBC WITH DIFFERENTIAL/PLATELET
Basophils Absolute: 0 10*3/uL (ref 0.0–0.2)
Basos: 0 %
EOS (ABSOLUTE): 0.5 10*3/uL — AB (ref 0.0–0.4)
Eos: 6 %
Hematocrit: 39.2 % (ref 34.0–46.6)
Hemoglobin: 13.2 g/dL (ref 11.1–15.9)
Immature Grans (Abs): 0 10*3/uL (ref 0.0–0.1)
Immature Granulocytes: 0 %
LYMPHS: 24 %
Lymphocytes Absolute: 2 10*3/uL (ref 0.7–3.1)
MCH: 29.7 pg (ref 26.6–33.0)
MCHC: 33.7 g/dL (ref 31.5–35.7)
MCV: 88 fL (ref 79–97)
Monocytes Absolute: 0.7 10*3/uL (ref 0.1–0.9)
Monocytes: 8 %
Neutrophils Absolute: 5.2 10*3/uL (ref 1.4–7.0)
Neutrophils: 62 %
PLATELETS: 344 10*3/uL (ref 150–450)
RBC: 4.45 x10E6/uL (ref 3.77–5.28)
RDW: 14.3 % (ref 12.3–15.4)
WBC: 8.5 10*3/uL (ref 3.4–10.8)

## 2018-05-23 LAB — ALPHA-1 ANTITRYPSIN PHENOTYPE: A-1 Antitrypsin: 144 mg/dL (ref 90–200)

## 2018-05-23 LAB — IGG, IGA, IGM
IgA/Immunoglobulin A, Serum: 410 mg/dL — ABNORMAL HIGH (ref 87–352)
IgG (Immunoglobin G), Serum: 1664 mg/dL — ABNORMAL HIGH (ref 700–1600)
IgM (Immunoglobulin M), Srm: 93 mg/dL (ref 26–217)

## 2018-05-23 NOTE — Progress Notes (Signed)
Chart and office note reviewed in detail  > agree with a/p as outlined    

## 2018-05-27 ENCOUNTER — Ambulatory Visit (HOSPITAL_BASED_OUTPATIENT_CLINIC_OR_DEPARTMENT_OTHER)
Admission: RE | Admit: 2018-05-27 | Discharge: 2018-05-27 | Disposition: A | Discharge status: Home / Self Care | Payer: Medicare Other | Source: Ambulatory Visit | Attending: Internal Medicine | Admitting: Internal Medicine

## 2018-05-27 ENCOUNTER — Ambulatory Visit (HOSPITAL_BASED_OUTPATIENT_CLINIC_OR_DEPARTMENT_OTHER)
Admission: RE | Admit: 2018-05-27 | Discharge: 2018-05-27 | Disposition: A | Discharge status: Home / Self Care | Payer: Medicare Other | Source: Ambulatory Visit | Attending: Adult Health | Admitting: Adult Health

## 2018-05-27 DIAGNOSIS — I2510 Atherosclerotic heart disease of native coronary artery without angina pectoris: Secondary | ICD-10-CM | POA: Diagnosis not present

## 2018-05-27 DIAGNOSIS — R05 Cough: Secondary | ICD-10-CM | POA: Diagnosis not present

## 2018-05-27 DIAGNOSIS — R059 Cough, unspecified: Secondary | ICD-10-CM

## 2018-05-27 DIAGNOSIS — J479 Bronchiectasis, uncomplicated: Secondary | ICD-10-CM

## 2018-05-27 DIAGNOSIS — J342 Deviated nasal septum: Secondary | ICD-10-CM | POA: Insufficient documentation

## 2018-05-27 DIAGNOSIS — J341 Cyst and mucocele of nose and nasal sinus: Secondary | ICD-10-CM | POA: Insufficient documentation

## 2018-05-27 DIAGNOSIS — J471 Bronchiectasis with (acute) exacerbation: Secondary | ICD-10-CM | POA: Diagnosis not present

## 2018-05-27 DIAGNOSIS — K760 Fatty (change of) liver, not elsewhere classified: Secondary | ICD-10-CM | POA: Diagnosis not present

## 2018-05-27 DIAGNOSIS — I700 Atherosclerosis of aorta: Secondary | ICD-10-CM | POA: Insufficient documentation

## 2018-05-28 ENCOUNTER — Encounter: Payer: Self-pay | Admitting: Internal Medicine

## 2018-05-28 DIAGNOSIS — J329 Chronic sinusitis, unspecified: Secondary | ICD-10-CM | POA: Insufficient documentation

## 2018-05-30 NOTE — Progress Notes (Signed)
LMTCB

## 2018-05-31 ENCOUNTER — Other Ambulatory Visit: Payer: Self-pay | Admitting: Internal Medicine

## 2018-05-31 MED ORDER — MONTELUKAST SODIUM 10 MG PO TABS: 10.0000 mg | ORAL_TABLET | Freq: Every day | ORAL | 11 refills | Status: DC

## 2018-05-31 NOTE — Progress Notes (Signed)
LMTCB

## 2018-06-01 NOTE — Progress Notes (Signed)
LMTCB

## 2018-06-03 ENCOUNTER — Telehealth: Payer: Self-pay | Admitting: Adult Health

## 2018-06-03 ENCOUNTER — Encounter: Payer: Self-pay | Admitting: *Deleted

## 2018-06-03 DIAGNOSIS — J329 Chronic sinusitis, unspecified: Secondary | ICD-10-CM

## 2018-06-03 DIAGNOSIS — Z87891 Personal history of nicotine dependence: Secondary | ICD-10-CM | POA: Insufficient documentation

## 2018-06-03 NOTE — Progress Notes (Signed)
Letter mailed

## 2018-06-03 NOTE — Telephone Encounter (Signed)
ATC Patient.  Left message for her to call back for CT results.

## 2018-06-07 NOTE — Telephone Encounter (Signed)
Attempted to call patient today regarding results. I did not receive an answer at time of call. I have left a voicemail message for pt to return call. X2  

## 2018-06-08 NOTE — Telephone Encounter (Signed)
Called patient, unable to reach left message to give us a call back. 

## 2018-06-09 ENCOUNTER — Encounter: Payer: Medicare Other | Admitting: Adult Health

## 2018-06-09 NOTE — Telephone Encounter (Signed)
Called and spoke to patient. Relayed results of CT of chest and sinus. See below in JJ's note the CT chest.  Sinus results:  Study c/w chronic sinusitis with polyps/cysts probably contributing to symptoms [facility]  Patient verbalized understanding and reports upcoming appointment with MW next week and if she has any questions she will bring them at that time. Placed order for referral to ENT. Nothing further needed at this time.

## 2018-06-09 NOTE — Telephone Encounter (Signed)
Patient returned phone call. °

## 2018-06-09 NOTE — Telephone Encounter (Signed)
Result Notes for CT CHEST HIGH RESOLUTION  Notes recorded by Shon Hale, CMA on 06/07/2018 at 5:00 PM EDT [facility] for pt ------  Notes recorded by Shon Hale, CMA on 06/01/2018 at 4:35 PM EDT [facility] for pt ------  Notes recorded by Melvenia Needles, NP on 06/01/2018 at 11:29 AM EDT CT shows scattered bronchiectasis - will discuss in detail at follow up ov with Dr. Melvyn Novas   Cont w/ ov recs Please contact office for sooner follow up if symptoms do not improve or worsen or seek emergency care    Incidental finding-CAD and fatty liver noted, discuss with PCP if further testing needed.   Called spoke with patient who reported that she is driving and requested to be called this afternoon.  Will leave in triage.

## 2018-06-14 ENCOUNTER — Other Ambulatory Visit: Payer: Self-pay | Admitting: Internal Medicine

## 2018-06-14 DIAGNOSIS — J479 Bronchiectasis, uncomplicated: Secondary | ICD-10-CM

## 2018-06-15 ENCOUNTER — Ambulatory Visit: Payer: Medicare Other | Admitting: Internal Medicine

## 2018-06-15 ENCOUNTER — Encounter: Payer: Self-pay | Admitting: Internal Medicine

## 2018-06-15 ENCOUNTER — Ambulatory Visit (INDEPENDENT_AMBULATORY_CARE_PROVIDER_SITE_OTHER): Payer: Medicare Other | Admitting: Internal Medicine

## 2018-06-15 VITALS — BP 102/64 | HR 89 | Ht 58.25 in | Wt 180.0 lb

## 2018-06-15 DIAGNOSIS — J479 Bronchiectasis, uncomplicated: Secondary | ICD-10-CM | POA: Diagnosis not present

## 2018-06-15 DIAGNOSIS — J329 Chronic sinusitis, unspecified: Secondary | ICD-10-CM

## 2018-06-15 LAB — PULMONARY FUNCTION TEST
DL/VA % pred: 100 %
DL/VA: 3.96 ml/min/mmHg/L
DLCO COR % PRED: 89 %
DLCO COR: 14.8 ml/min/mmHg
DLCO UNC: 14.71 ml/min/mmHg
DLCO unc % pred: 89 %
FEF 25-75 POST: 0.76 L/s
FEF 25-75 Pre: 0.68 L/sec
FEF2575-%Change-Post: 12 %
FEF2575-%PRED-PRE: 43 %
FEF2575-%Pred-Post: 48 %
FEV1-%Change-Post: 0 %
FEV1-%PRED-POST: 84 %
FEV1-%Pred-Pre: 84 %
FEV1-POST: 1.46 L
FEV1-Pre: 1.46 L
FEV1FVC-%Change-Post: 6 %
FEV1FVC-%Pred-Pre: 91 %
FEV6-%Change-Post: -5 %
FEV6-%Pred-Post: 88 %
FEV6-%Pred-Pre: 93 %
FEV6-POST: 1.93 L
FEV6-Pre: 2.04 L
FEV6FVC-%CHANGE-POST: 1 %
FEV6FVC-%PRED-POST: 104 %
FEV6FVC-%Pred-Pre: 102 %
FVC-%Change-Post: -6 %
FVC-%PRED-PRE: 90 %
FVC-%Pred-Post: 84 %
FVC-PRE: 2.1 L
FVC-Post: 1.95 L
PRE FEV1/FVC RATIO: 70 %
Post FEV1/FVC ratio: 75 %
Post FEV6/FVC ratio: 99 %
Pre FEV6/FVC Ratio: 98 %
RV % pred: 114 %
RV: 2.18 L
TLC % PRED: 103 %
TLC: 4.36 L

## 2018-06-15 NOTE — Progress Notes (Signed)
Subjective:     Patient ID: Adriana White, female   DOB: [birth year],    MRN: [id number]   Brief patient profile:  24 yowf  From [place] Quit smoking in 1986 when first and last baby born with baseline of 160 then around  Late  1990s  noted cough and eval by [facility] pulmonologist and eventually dx'd bronchiectasis placed  On advair seemed to help symtoms of sob/ cough initially but worse since around 12/2017 so self referred to pulmonary clinic 03/11/2018      History of Present Illness  03/11/2018 1st [facility] office visit/ Adriana White   Chief Complaint  Patient presents with  . Pulmonary Consult    Self referral. She states dxed with bronchiectasis approx 20 yrs ago. She c/o cough with tan sputum and increased SOB over the past 2 months. She states she gets SOB walking 1/2 a block.   pt with bronchiectasis worse cough x 2 months esp hs  With thick brown mucus    s assoc with nasal congestion and overt HB on fosfamax and ppi  but  no cp and never had hemoptysis with flares in past  Sob walking dog esp hills @ moderate pace  rec Stop advair and alendronate for now Augmentin 875 mg take one pill twice daily  X 10 days - take at breakfast and supper with large glass of water.  It would help reduce the usual side effects (diarrhea and yeast infections) if you ate cultured yogurt at lunch.  Symbicort 80 Take 2 puffs first thing in am and then another 2 puffs about 12 hours later.  Work on inhaler technique:  relax and gently blow all the way out then take a nice smooth deep breath back in, triggering the inhaler at same time you start breathing in.  Hold for up to 5 seconds if you can. Blow out thru nose. Rinse and gargle with water when done Change Omeprazole to take  20 mg x 30- 60 min before your first and last meals of the day  GERD diet  For cough/ congestion  > mucinex or mucinex dm up  To 1200 mg every 12 hours as needed Please schedule a follow up office visit in 2 weeks to see one our NPS,  sooner if needed  with all medications /inhalers/ solutions in hand so we can verify exactly what you are taking. This includes all medications from all doctors and over the counters - set up for pfts on return to see me in 6 weeks from now  - also needs f/u cxr and Ig's and alpha one screen      05/04/2018  f/u ov/Adriana White re: bronchiectasis  / did not bring meds / did not schedule pfts  Chief Complaint  Patient presents with  . Acute Visit    Pt c/o "stuffed up" and prod cough with tan sputum 6 days.     breathing is better, able to walk dog easier but cough no better and more brown mucus x one week prior to OV   Overusing saba  rec Symbicort 80 Take 2 puffs first thing in am and then another 2 puffs about 12 hours later.  Work on inhaler technique:  Change Omeprazole to take  20 mg x 30- 60 min before your first and last meals of the day  GERD (REFLUX)  is an extremely common cause of respiratory symptoms just like yours , many times with no obvious heartburn at all.  For cough/ congestion  >  mucinex or mucinex dm up  To 1200 mg every 12 hours as needed         06/15/2018  f/u ov/Adriana White re: bronchiectasis/ sinusitis  Chief Complaint  Patient presents with  . Follow-up    PFT's done today. Breathing has improved slightly- relates to using sinus rinse daily.    Dyspnea:  Walking dog / shopping ok  = MMRC2 = can't walk a nl pace on a flat grade s sob but does fine slow and flat e Cough: esp in am but also thruout the day, seening ent 06/17/18 > beige mucus Sleeping: flat  SABA use: no rescue 02: none     No obvious day to day or daytime variability or assoc excess/ purulent sputum or mucus plugs or hemoptysis or cp or chest tightness, subjective wheeze or overt sinus or hb symptoms.   Sleeping as above without nocturnal  or early am exacerbation  of respiratory  c/o's or need for noct saba. Also denies any obvious fluctuation of symptoms with weather or environmental changes or other  aggravating or alleviating factors except as outlined above   No unusual exposure hx or h/o childhood pna/ asthma or knowledge of premature birth.  Current Allergies, Complete Past Medical History, Past Surgical History, Family History, and Social History were reviewed in [employer] record.  ROS  The following are not active complaints unless bolded Hoarseness, sore throat, dysphagia, dental problems, itching, sneezing,  nasal congestion or discharge of excess mucus or purulent secretions, ear ache,   fever, chills, sweats, unintended wt loss or wt gain, classically pleuritic or exertional cp,  orthopnea pnd or arm/hand swelling  or leg swelling, presyncope, palpitations, abdominal pain, anorexia, nausea, vomiting, diarrhea  or change in bowel habits or change in bladder habits, change in stools or change in urine, dysuria, hematuria,  rash, arthralgias, visual complaints, headache, numbness, weakness or ataxia or problems with walking or coordination,  change in mood or  memory.        Current Meds  Medication Sig  . ARIPiprazole (ABILIFY) 2 MG tablet Take 0.5 tablets by mouth daily.  Marland Kitchen aspirin 81 MG chewable tablet Chew 1 tablet by mouth daily.  Marland Kitchen atorvastatin (LIPITOR) 40 MG tablet Take 1 tablet by mouth daily.  . Biotin 1000 MCG tablet Take 1 tablet by mouth daily.  . budesonide-formoterol (SYMBICORT) 80-4.5 MCG/ACT inhaler Inhale 2 puffs into the lungs 2 (two) times daily.  . cetirizine (ZYRTEC ALLERGY) 10 MG tablet Take 10 mg by mouth daily.  . clonazePAM (KLONOPIN) 0.5 MG tablet Take 1 tablet by mouth daily as needed.  . DULoxetine (CYMBALTA) 60 MG capsule Take 1 capsule by mouth daily.  . Hypertonic Nasal Wash (SINUS RINSE NA) Place into the nose daily.  Marland Kitchen levothyroxine (SYNTHROID, LEVOTHROID) 25 MCG tablet Take 1 tablet by mouth daily.  Marland Kitchen losartan (COZAAR) 25 MG tablet Take 1 tablet by mouth daily.  . montelukast (SINGULAIR) 10 MG tablet Take 1 tablet (10 mg  total) by mouth at bedtime.  Marland Kitchen omeprazole (PRILOSEC) 20 MG capsule Take 2 tablets by mouth daily.  Marland Kitchen Respiratory Therapy Supplies (FLUTTER) DEVI Use as directed  . traZODone (DESYREL) 100 MG tablet Take 1 tablet by mouth at bedtime.            Objective:   Physical Exam  amb wf less nasal tone to voice      06/15/2018        180   05/04/18 183 lb 9.6  oz (83.3 kg)  03/31/18 189 lb 12.8 oz (86.1 kg)  03/11/18 193 lb (87.5 kg)     Vital signs reviewed - Note on arrival 02 sats  98% on RA       HEENT: nl dentition, turbinates bilaterally, and oropharynx. Nl external ear canals without cough reflex   NECK :  without JVD/Nodes/TM/ nl carotid upstrokes bilaterally   LUNGS: no acc muscle use,  Nl contour chest  With end exp rhonchi  bilaterally without cough on insp or exp maneuvers   CV:  RRR  no s3 or murmur or increase in P2, and no edema   ABD:  soft and nontender with nl inspiratory excursion in the supine position. No bruits or organomegaly appreciated, bowel sounds nl  MS:  Nl gait/ ext warm without deformities, calf tenderness, cyanosis or clubbing No obvious joint restrictions   SKIN: warm and dry without lesions    NEURO:  alert, approp, nl sensorium with  no motor or cerebellar deficits apparent.          I personally reviewed images and agree with radiology impression as follows:   Chest HRCT  05/27/18  1. Scattered areas of cylindrical and mild varicose bronchiectasis with associated post infectious or inflammatory scarring, as above. 2. Aortic atherosclerosis, in addition to 2 vessel coronary artery disease. Assessment for potential risk factor modification, dietary therapy or pharmacologic therapy may be warranted, if clinically indicated.        Assessment:

## 2018-06-15 NOTE — Progress Notes (Signed)
PFT done today. 

## 2018-06-15 NOTE — Patient Instructions (Addendum)
No change in medications and keep appt to see Dr [name] - if he feels allergies are an issue I will refer you to Dr [name] group    Please schedule a follow up visit in 3 months but call sooner if needed  with all medications /inhalers/ solutions in hand so we can verify exactly what you are taking. This includes all medications from all doctors and over the counters

## 2018-06-16 ENCOUNTER — Encounter: Payer: Self-pay | Admitting: Internal Medicine

## 2018-06-16 NOTE — Assessment & Plan Note (Signed)
CT sinus 05/27/18> 1. 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 2. Moderate mucosal thickening of the maxillary sinuses and the sphenoid sinuses. 3. Patent frontal sinus drainage pathways, sphenoid ethmoid recesses, and left ostiomeatal unit. 4. Opacified right ostiomeatal unit. 5. Moderate rightward nasal septal deviation>>  Referred to ENT > Adriana White 06/17/18

## 2018-06-16 NOTE — Assessment & Plan Note (Signed)
03/11/2018    try symbicort 80 2bid and stop fosfamax - Quant Ig's  05/19/18 nl  - Alpha One AT screen 05/19/18  neg - 05/04/2018     > continue symb 80 2bid   -HRCT chest 05/2018 >scattered bronchiectasis , scarring  - PFT's  06/15/2018  FEV1 1.46 (84 % ) ratio 75  p 0 % improvement from saba p symb 80 x 2  prior to study with DLCO  89/89 % corrects to 100  % for alv volume     I had an extended discussion with the patient reviewing all relevant studies completed to date and  lasting 15 to 20 minutes of a 25 minute visit on the following ongoing concerns:   See device teaching which extended face to face time for this visit.  1) reviewed progress to date and studies including incidental finding of 2 vessel CAD implications  2) management of obst bronchiectasis key is improving sinusitis/ max cough mechanics/ use of flutter  3) Each maintenance medication was reviewed in detail including most importantly the difference between maintenance and as needed and under what circumstances the prns are to be used.  Please see AVS for specific  Instructions which are unique to this visit and I personally typed out  which were reviewed in detail in writing with the patient and a copy provided.  t  4) - The proper method of use, as well as anticipated side effects, of a metered-dose inhaler are discussed and demonstrated to the patient using teach back technique

## 2018-06-17 DIAGNOSIS — J342 Deviated nasal septum: Secondary | ICD-10-CM | POA: Insufficient documentation

## 2018-06-22 ENCOUNTER — Telehealth: Payer: Self-pay | Admitting: Internal Medicine

## 2018-06-22 MED ORDER — BUDESONIDE-FORMOTEROL FUMARATE 80-4.5 MCG/ACT IN AERO: 2.0000 | INHALATION_SPRAY | Freq: Two times a day (BID) | RESPIRATORY_TRACT | 0 refills | Status: DC

## 2018-06-22 NOTE — Telephone Encounter (Signed)
Spoke with patient. She stated that she will not have Medicare Part D coverage until January 2020. The Symbicort is too expensive and she is not able to afford it.   Explained to patient that I will leave two samples of Symbicort 80 up front. Also explained in detail of how patient can apply for patient assistance. Will have the application up front for the patient to fill out her portion. She will come by the office as soon as she has received her [employer]SI award letter.   Nothing further needed at time of call.

## 2018-06-22 NOTE — Telephone Encounter (Signed)
Patient returning call - she can be reached at [phone]

## 2018-06-22 NOTE — Telephone Encounter (Signed)
Left message for patient to call back  

## 2018-06-24 ENCOUNTER — Telehealth: Payer: Self-pay | Admitting: Internal Medicine

## 2018-06-24 NOTE — Telephone Encounter (Signed)
Patient given [place]. Explained to patient to fill out all high lighted areas and return. Nothing further needed at this time.

## 2018-07-14 ENCOUNTER — Telehealth: Payer: Self-pay | Admitting: Internal Medicine

## 2018-07-14 NOTE — Telephone Encounter (Signed)
Called and spoke to pt. Pt had questions regarding [place] application.  Pt states on the application, it is checked no for supplement insurance. Pt stated that she does have supplement insurance. I have suggested that she check yes to supplement insurance.  Pt voiced her understanding and had no further questions. Nothing further is needed.

## 2018-07-22 ENCOUNTER — Telehealth: Payer: Self-pay | Admitting: Internal Medicine

## 2018-07-22 MED ORDER — BUDESONIDE-FORMOTEROL FUMARATE 80-4.5 MCG/ACT IN AERO: 2.0000 | INHALATION_SPRAY | Freq: Two times a day (BID) | RESPIRATORY_TRACT | 3 refills | Status: DC

## 2018-07-22 NOTE — Telephone Encounter (Signed)
Papers have been put in Dr. Thurston Hole look at folder to be signed.   Routing to [place] for follow up

## 2018-07-25 NOTE — Telephone Encounter (Signed)
Forms signed and faxed.

## 2018-08-05 ENCOUNTER — Telehealth: Payer: Self-pay | Admitting: Internal Medicine

## 2018-08-05 MED ORDER — BUDESONIDE-FORMOTEROL FUMARATE 80-4.5 MCG/ACT IN AERO: 2.0000 | INHALATION_SPRAY | Freq: Two times a day (BID) | RESPIRATORY_TRACT | 11 refills | Status: DC

## 2018-08-05 NOTE — Telephone Encounter (Signed)
1 sample up front for pick up  Called pt and left a detailed msg that this was done

## 2018-08-09 DIAGNOSIS — J3089 Other allergic rhinitis: Secondary | ICD-10-CM | POA: Insufficient documentation

## 2018-08-12 ENCOUNTER — Telehealth: Payer: Self-pay | Admitting: Internal Medicine

## 2018-08-12 MED ORDER — BUDESONIDE-FORMOTEROL FUMARATE 80-4.5 MCG/ACT IN AERO: 2.0000 | INHALATION_SPRAY | Freq: Two times a day (BID) | RESPIRATORY_TRACT | 0 refills | Status: DC

## 2018-08-12 NOTE — Telephone Encounter (Signed)
Called and spoke with Patient.  Symbicort 80 samples requested and placed in bag at the front desk  for Patient to pick up. Patient aware.  Nothing further at this time.

## 2018-08-25 ENCOUNTER — Telehealth: Payer: Self-pay | Admitting: Internal Medicine

## 2018-08-25 NOTE — Telephone Encounter (Signed)
Returned call to Patient.  Per DMR- ok to leave message on VM.  Left detailed message for Patient, letting her know that Dr. Lucie Leather has access to Epic through the [facility] system.  Nothing further at this time.

## 2018-09-05 ENCOUNTER — Telehealth: Payer: Self-pay | Admitting: Internal Medicine

## 2018-09-05 MED ORDER — BUDESONIDE-FORMOTEROL FUMARATE 80-4.5 MCG/ACT IN AERO: 2.0000 | INHALATION_SPRAY | Freq: Two times a day (BID) | RESPIRATORY_TRACT | 0 refills | Status: DC

## 2018-09-05 NOTE — Telephone Encounter (Signed)
Called and spoke to patient, requesting samples of symbicort 80 .

## 2018-09-05 NOTE — Addendum Note (Signed)
Addended by: Kerin RansomBLACKWELL, Tysin Salada on: 09/05/2018 04:21 PM   Modules accepted: Orders

## 2018-09-05 NOTE — Telephone Encounter (Signed)
Called and spoke to patient, patient requesting a sample of symbicort 80. Patient would also like an update regarding her [employer] application. Sample placed upfront for pick up. Patient aware. Call made to [facility], recording states the estimated wait time is greater than 30 minutes. Will leave in triage to try and call back early in AM.

## 2018-09-12 ENCOUNTER — Encounter: Payer: Self-pay | Admitting: Allergy and Immunology

## 2018-09-12 ENCOUNTER — Ambulatory Visit (INDEPENDENT_AMBULATORY_CARE_PROVIDER_SITE_OTHER): Payer: Medicare Other | Admitting: Allergy and Immunology

## 2018-09-12 VITALS — BP 134/76 | HR 98 | Temp 99.4°F | Resp 18 | Ht <= 58 in | Wt 187.4 lb

## 2018-09-12 DIAGNOSIS — J4540 Moderate persistent asthma, uncomplicated: Secondary | ICD-10-CM

## 2018-09-12 DIAGNOSIS — J479 Bronchiectasis, uncomplicated: Secondary | ICD-10-CM | POA: Diagnosis not present

## 2018-09-12 DIAGNOSIS — D721 Eosinophilia, unspecified: Secondary | ICD-10-CM

## 2018-09-12 DIAGNOSIS — K219 Gastro-esophageal reflux disease without esophagitis: Secondary | ICD-10-CM

## 2018-09-12 DIAGNOSIS — J324 Chronic pansinusitis: Secondary | ICD-10-CM | POA: Diagnosis not present

## 2018-09-12 DIAGNOSIS — J3089 Other allergic rhinitis: Secondary | ICD-10-CM

## 2018-09-12 MED ORDER — ALBUTEROL SULFATE HFA 108 (90 BASE) MCG/ACT IN AERS: INHALATION_SPRAY | RESPIRATORY_TRACT | 1 refills | Status: DC

## 2018-09-12 MED ORDER — OMEPRAZOLE 40 MG PO CPDR: 40.0000 mg | DELAYED_RELEASE_CAPSULE | ORAL | 5 refills | Status: DC

## 2018-09-12 MED ORDER — FAMOTIDINE 40 MG PO TABS: 40.0000 mg | ORAL_TABLET | Freq: Every day | ORAL | 5 refills | Status: DC

## 2018-09-12 NOTE — Progress Notes (Signed)
Dear [name],  Thank you for referring Adriana White to the [facility] on 09/12/2018.   Below is a summation of this patient's evaluation and recommendations.  Thank you for your referral. I will keep you informed about this patient's response to treatment.   If you have any questions please do not hesitate to contact me.   Sincerely,  Jessica Priest, MD Allergy / Immunology [facility]   ______________________________________________________________________    NEW PATIENT NOTE  Referring Provider: Derinda Sis, * Primary Provider: Derinda Sis, MD Date of office visit: 09/12/2018    Subjective:   Chief Complaint:  Adriana White (DOB: Sep 05, 1946) is a 72 y.o. female who presents to the clinic on 09/12/2018 with a chief complaint of Cough (Productive cough) .     HPI: Adriana White presents to this clinic in evaluation of cough.  Adriana White states that she has had a cough for 20 years which has been a progressive issue.  She coughs both during the daytime and nighttime and occasionally her cough is associated with micturation and gagging but no vomiting.  In association with this issue is a decreased ability to smell and some nasal congestion.    Diagnostic evaluation performed in the past has led to the diagnosis of both bronchiectasis and chronic sinusitis.  She has been treated with 3 different antibiotics over the course of the past month or 2 which has not helped her cough at all.  She has been treated with multiple inhalers currently and in the past and is currently using a nasal steroid and a leukotriene modifier which has not helped her at all.  She is scheduled to have sinus surgery with Dr. Jearld Fenton in early December.  She did smoke from [phone] at a rate of 1-1/2 packs of cigarettes per day.  She did not have any unusual particulate matter exposure during her  early years or presently.  She does have reflux disease that she believes is completely under control with the use of omeprazole.  She does get heartburn very high up in her chest if she misses her omeprazole but otherwise it is completely controlled with this agent.  She does drink 1 or 2 caffeinated drinks per day.  Past Medical History:  Diagnosis Date  . Bronchiectasis (HCC)   . Fatty liver   . Hyperlipidemia   . Hypertension   . OSA (obstructive sleep apnea)   . Ulcerative colitis [facility])     Past Surgical History:  Procedure Laterality Date  . ABDOMINAL HERNIA REPAIR    . ABDOMINAL PERINEAL BOWEL RESECTION    . CESAREAN SECTION    . TONSILLECTOMY    . UTERINE FIBROID SURGERY      Allergies as of 09/12/2018      Reactions   Olsalazine Rash   Other reaction(s): Other (See Comments) unknown Pt. Not sure of reaction Pt. Not sure of reaction   Sulfamethoxazole Rash   Patient not sure why. Patient not sure why.      Medication List      albuterol 108 (90 Base) MCG/ACT inhaler Commonly known as:  PROVENTIL HFA;VENTOLIN HFA Inhale 2 puffs into the lungs every 4 (four) hours as needed.   ARIPiprazole 2 MG tablet Commonly known as:  ABILIFY Take 0.5 tablets by mouth daily.   aspirin 81 MG chewable tablet Chew 1 tablet by mouth daily.   atorvastatin 40  MG tablet Commonly known as:  LIPITOR Take 1 tablet by mouth daily.   Biotin 1000 MCG tablet Take 1 tablet by mouth daily.   budesonide-formoterol 80-4.5 MCG/ACT inhaler Commonly known as:  SYMBICORT Inhale 2 puffs into the lungs 2 (two) times daily.   buPROPion 150 MG 24 hr tablet Commonly known as:  WELLBUTRIN XL Take 150 mg by mouth daily.   clonazePAM 0.5 MG tablet Commonly known as:  KLONOPIN Take 1 tablet by mouth daily as needed.   DULoxetine 60 MG capsule Commonly known as:  CYMBALTA Take 1 capsule by mouth daily.   FLUTTER Devi Use as directed   glucosamine-chondroitin 500-400 MG tablet Take  1 tablet by mouth daily.   levothyroxine 25 MCG tablet Commonly known as:  SYNTHROID, LEVOTHROID Take 1 tablet by mouth daily.   losartan 25 MG tablet Commonly known as:  COZAAR Take 1 tablet by mouth daily.   montelukast 10 MG tablet Commonly known as:  SINGULAIR Take 1 tablet (10 mg total) by mouth at bedtime.   nystatin powder Commonly known as:  MYCOSTATIN/NYSTOP APPLY TWICE DAILY AS NEEDED TO AFFECTED AREAS   nystatin-triamcinolone ointment Commonly known as:  MYCOLOG APPLY TO AFFECTED AREA TWICE DAILY AS NEEDED   Omega-3 1000 MG Caps Take by mouth.   omeprazole 20 MG capsule Commonly known as:  PRILOSEC Take 2 tablets by mouth daily.   SINUS RINSE NA Place into the nose daily.   traZODone 100 MG tablet Commonly known as:  DESYREL Take 1 tablet by mouth at bedtime.   VITAMIN D3 PO Take 2,000 Units by mouth daily.   VSL#3 DS PO Take by mouth daily.       Review of systems negative except as noted in HPI / PMHx or noted below:  Review of Systems  Constitutional: Negative.   HENT: Negative.   Eyes: Negative.   Respiratory: Negative.   Cardiovascular: Negative.   Gastrointestinal: Negative.   Genitourinary: Negative.   Musculoskeletal: Negative.   Skin: Negative.   Neurological: Negative.   Endo/Heme/Allergies: Negative.   Psychiatric/Behavioral: Negative.     Family History  Problem Relation Age of Onset  . COPD Mother   . Heart attack Mother   . Hodgkin's lymphoma Father   . Alcoholism Brother   . Cancer Brother     Social History   Socioeconomic History  . Marital status: Single    Spouse name: Not on file  . Number of children: Not on file  . Years of education: Not on file  . Highest education level: Not on file  Occupational History  . Not on file  Social Needs  . Financial resource strain: Not on file  . Food insecurity:    Worry: Not on file    Inability: Not on file  . Transportation needs:    Medical: Not on file     Non-medical: Not on file  Tobacco Use  . Smoking status: Former Smoker    Packs/day: 1.50    Years: 17.00    Pack years: 25.50    Types: Cigarettes    Last attempt to quit: 10/19/1984    Years since quitting: 33.9  . Smokeless tobacco: Never Used  Substance and Sexual Activity  . Alcohol use: Not Currently  . Drug use: Never  . Sexual activity: Not on file  Lifestyle  . Physical activity:    Days per week: Not on file    Minutes per session: Not on file  . Stress: Not  on file  Relationships  . Social connections:    Talks on phone: Not on file    Gets together: Not on file    Attends religious service: Not on file    Active member of club or organization: Not on file    Attends meetings of clubs or organizations: Not on file    Relationship status: Not on file  . Intimate partner violence:    Fear of current or ex partner: Not on file    Emotionally abused: Not on file    Physically abused: Not on file    Forced sexual activity: Not on file  Other Topics Concern  . Not on file  Social History Narrative  . Not on file    Environmental and Social history  Lives in a apartment with a dry environment, a dog located inside the household, no carpet in the bedroom, no plastic on the bed, no plastic on the pillow, and no smoking ongoing with inside the household.  Objective:   Vitals:   09/12/18 1419  BP: 134/76  Pulse: 98  Resp: 18  Temp: 99.4 F (37.4 C)  SpO2: 92%   Height: 4' 9.4" (145.8 cm) Weight: 187 lb 6.4 oz (85 kg)  Physical Exam  Constitutional:  Nasal voice  HENT:  Head: Normocephalic. Head is without right periorbital erythema and without left periorbital erythema.  Right Ear: Tympanic membrane, external ear and ear canal normal.  Left Ear: Tympanic membrane, external ear and ear canal normal.  Nose: Nose normal. No mucosal edema or rhinorrhea.  Mouth/Throat: Oropharynx is clear and moist and mucous membranes are normal. No oropharyngeal exudate.    Eyes: Pupils are equal, round, and reactive to light. Conjunctivae and lids are normal.  Neck: Trachea normal. No tracheal deviation present. No thyromegaly present.  Cardiovascular: Normal rate, regular rhythm, S1 normal, S2 normal and normal heart sounds.  No murmur heard. Pulmonary/Chest: Effort normal. No stridor. No respiratory distress. She has wheezes (Inspiratory and expiratory coarse airway sounds with wheezing and rhonchi.). She has no rales. She exhibits no tenderness.  Abdominal: Soft. She exhibits no distension and no mass. There is no hepatosplenomegaly. There is no tenderness. There is no rebound and no guarding.  Musculoskeletal: She exhibits no edema or tenderness.  Lymphadenopathy:       Head (right side): No tonsillar adenopathy present.       Head (left side): No tonsillar adenopathy present.    She has no cervical adenopathy.    She has no axillary adenopathy.  Neurological: She is alert.  Skin: No rash noted. She is not diaphoretic. No erythema. No pallor. Nails show no clubbing.    Diagnostics: Allergy skin tests were not performed secondary to the recent use of an antihistamine.   Spirometry was performed and demonstrated an FEV1 of 1.22 @ 71 % of predicted. FEV1/FVC = 0.68.  Following administration of nebulized albuterol her FEV1 did not improve    Results of a high-resolution chest CT scan obtained on 27 May 2018 identified the following:  Cardiovascular: Heart size is normal. There is no significant pericardial fluid, thickening or pericardial calcification. There is aortic atherosclerosis, as well as atherosclerosis of the great vessels of the mediastinum and the coronary arteries, including calcified atherosclerotic plaque in the left anterior descending and left circumflex coronary arteries.  Mediastinum/Nodes: No pathologically enlarged mediastinal or hilar lymph nodes. Please note that accurate exclusion of hilar adenopathy is limited on  noncontrast CT scans. Esophagus is unremarkable in  appearance. No axillary lymphadenopathy.  Lungs/Pleura: High-resolution images demonstrate widespread areas of cylindrical and mild varicose bronchiectasis, most severe in the lower lobes of the lungs bilaterally. In the regions of greatest involvement there is extensive thickening of the peribronchovascular interstitium with some peribronchovascular micronodularity, septal thickening and regional areas of architectural distortion, presumably chronic areas of post infectious or inflammatory scarring. High-resolution images demonstrate no honeycombing. Inspiratory and expiratory imaging is remarkable for some mild air trapping indicative of small airways disease. No confluent consolidative airspace disease. No pleural effusions.  Results of a sinus CT scan obtained 27 May 2018 identified the following:   Paranasal sinuses:  Frontal: Hypoplastic frontal sinuses with mild mucosal thickening. Patent frontal sinus drainage pathways.  Ethmoid: Normally aerated.  Maxillary: Large bilateral maxillary sinus mucous retention cyst/polyps. Moderate mucosal thickening.  Sphenoid: Large left sphenoid sinus mucous retention cyst/polyp. Moderate mucosal thickening. Chronic inflammatory changes of the walls of the sphenoid sinus. Patent sphenoid ethmoid recesses.  Right ostiomeatal unit: Occlusion of the right ostiomeatal unit with opacification of the infundibulum.  Left ostiomeatal unit: Patent.  Nasal passages: Patent. Intact nasal septum with moderate rightward nasal septal deviation.  Anatomy: No pneumatization superior to anterior ethmoid notches. Symmetric and intact olfactory grooves and fovea ethmoidalis, Keros II (4-797mm). Sellar sphenoid pneumatization pattern. No dehiscence of carotid or optic canals. No onodi cell.  Other: Orbits and intracranial compartment are unremarkable. Visible mastoid air cells are normally  aerated.  Results of the sinus CT scan obtained 23 August 2018 identified the following:  Paranasal sinuses:  Frontal: Clear right frontal sinus. Trace left frontal sinus mucosal thickening. Patent frontal sinus drainage pathways.  Ethmoid: Normally aerated.  Maxillary: Opacification of greater than 50% of the left maxillary sinus likely by a large mucous retention cyst. Moderate right maxillary sinus mucosal thickening.  Sphenoid: Mild right sphenoid sinus mucosal thickening with near complete opacification of the left sphenoid sinus. Possible fluid component in both sinuses. Effacement of both sinus ostia.  Right ostiomeatal unit: Opacification of the maxillary sinus ostium by mucosal thickening.  Left ostiomeatal unit: Patent.  Nasal passages: Patent. 6 mm rightward nasal septal deviation.  Anatomy: No pneumatization superior to anterior ethmoid notches. Keros II. Sellar sphenoid pneumatization pattern. No dehiscence of carotid or optic canals. No onodi cell.  Other: Technique does not allow for diagnostic visualization of the orbital, intracranial, and soft tissue structures.  Results of the chest x-ray obtained 31 March 2018 identified the following:  Prominent markings remain at the left lung base most consistent with atelectasis or scarring. Pneumonia would be difficult to exclude. No pleural effusion is seen. The right lung is clear. Mediastinal and hilar contours are unremarkable. The heart is mildly enlarged and stable. There are mild degenerative changes in the mid to lower thoracic spine.  Results of blood tests obtained 19 May 2018 identified serum IgE 159 KU/L, low levels of IgE antibodies directed against house dust mite, dog, pollens, and mold. IgG 1664 mg/DL, IgM 93 mg/DL, IgA 161410 mg/DL, WBC 8.5, absolute eosinophil 500, absolute lymphocyte 2000, hemoglobin 13.2, platelet 344  Assessment and Plan:    1. Not well controlled moderate persistent asthma   2.  Obstructive bronchiectasis (HCC)   3. Chronic pansinusitis   4. Perennial allergic rhinitis   5. LPRD (laryngopharyngeal reflux disease)   6. Eosinophilia     1.  Allergen avoidance measures - house dust mite, dog  2.  Treat and prevent inflammation:   A.  Symbicort 160-2 inhalations twice a  day with spacer  B.  Spiriva 1.25 Respimat-2 inhalations 1 time per day  C.  OTC Nasacort- 2 sprays each nostril 1 time per day  D.  Montelukast 10 mg - 1 tablet 1 time per day  3.  Treat and prevent reflux:   A.  Minimize use of caffeine and slowly taper  B.  Omeprazole 40 mg tablet in a.m.  C.  Famotidine 40 mg tablet in p.m.  4.  If needed:   A.  Albuterol MDI-2 inhalations every 4-6 hours  B.  Nasal saline  C.  OTC antihistamine  5.  Sinus surgery with Dr. Jearld Fenton  6.  Consideration for biological agent?  7.  Return to 4 weeks or earlier if problem  8. Annual fall flu vaccine every year  Adriana White appears to have significant insults to her airway including smoke induced architectural change with obstructive bronchiectasis, inflammation from atopic disease, and reflux induced respiratory disease, and chronic sinusitis.  I have set up a plan for her to address inflammation and reflux as noted above.  She will undergo sinus surgery with Dr. Jearld Fenton in early December.  I will see her back in this clinic and if she still continues to have significant problems then she would be a candidate for a anti-IL-5 agent given her eosinophilia.  Jessica Priest, MD Allergy / Immunology [facility]

## 2018-09-12 NOTE — Patient Instructions (Addendum)
  1.  Allergen avoidance measures -house dust mite, dog  2.  Treat and prevent inflammation:   A.  Symbicort 160-2 inhalations twice a day with spacer  B.  Spiriva 1.25 Respimat-2 inhalations 1 time per day  C.  OTC Nasacort- 2 sprays each nostril 1 time per day  D.  Montelukast 10 mg - 1 tablet 1 time per day  3.  Treat and prevent reflux:   A.  Minimize use of caffeine and slowly taper  B.  Omeprazole 40 mg tablet in a.m.  C.  Famotidine 40 mg tablet in p.m.  4.  If needed:   A.  Albuterol MDI-2 inhalations every 4-6 hours  B.  Nasal saline  C.  OTC antihistamine  5.  Sinus surgery with Dr. Jearld FentonByers  6.  Consideration for biological agent?  7.  Return to 4 weeks or earlier if problem  8. Annual fall flu vaccine every year

## 2018-09-13 ENCOUNTER — Ambulatory Visit: Payer: Medicare Other | Admitting: Internal Medicine

## 2018-09-13 ENCOUNTER — Encounter: Payer: Self-pay | Admitting: Allergy and Immunology

## 2018-09-16 ENCOUNTER — Ambulatory Visit: Payer: Medicare Other | Admitting: Internal Medicine

## 2018-10-04 ENCOUNTER — Ambulatory Visit: Payer: Self-pay | Admitting: Allergy and Immunology

## 2018-10-20 ENCOUNTER — Ambulatory Visit: Payer: Medicare Other | Admitting: Allergy and Immunology

## 2018-12-30 ENCOUNTER — Telehealth: Payer: Self-pay | Admitting: Internal Medicine

## 2018-12-30 NOTE — Telephone Encounter (Signed)
Patient called requesting samples of Symbicort. We are closed this afternoon, but she said she could come by on Monday to pick them up at the [facility] office.   Malachi Bonds, MD [facility]

## 2018-12-30 NOTE — Telephone Encounter (Signed)
Pt is calling back [phone]

## 2018-12-30 NOTE — Telephone Encounter (Signed)
I spoke with pt and advised her that I would leaver her samples of Symbicort up front to pick up. Nothing further is needed.

## 2019-01-02 IMAGING — DX DG CHEST 2V
2 series · 2 of 2 positions shown · non-contrast
Comparison: Chest x-ray of 03/11/2017

CLINICAL DATA: History of pneumonia, follow-up, former smoking
history

EXAM:
CHEST - 2 VIEW

[chest pa]
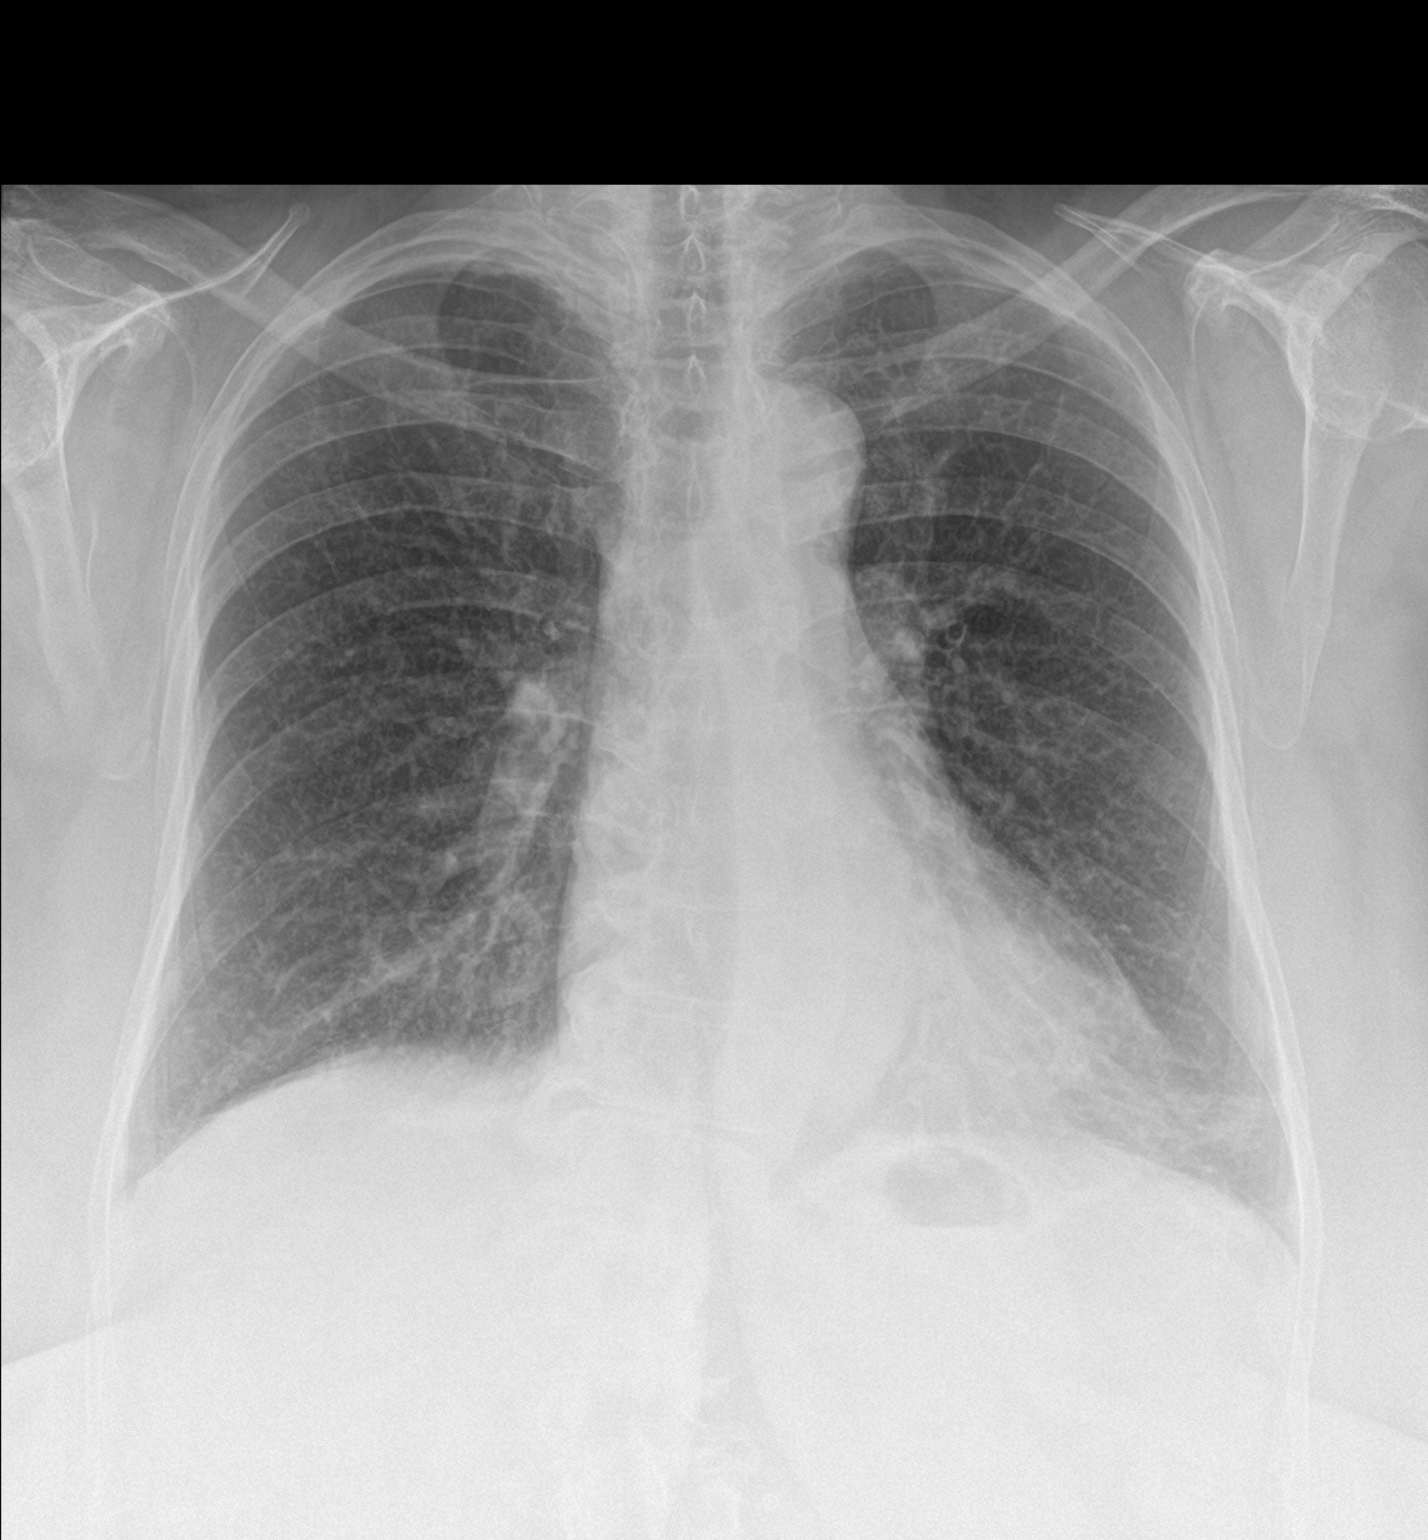

[chest lat]
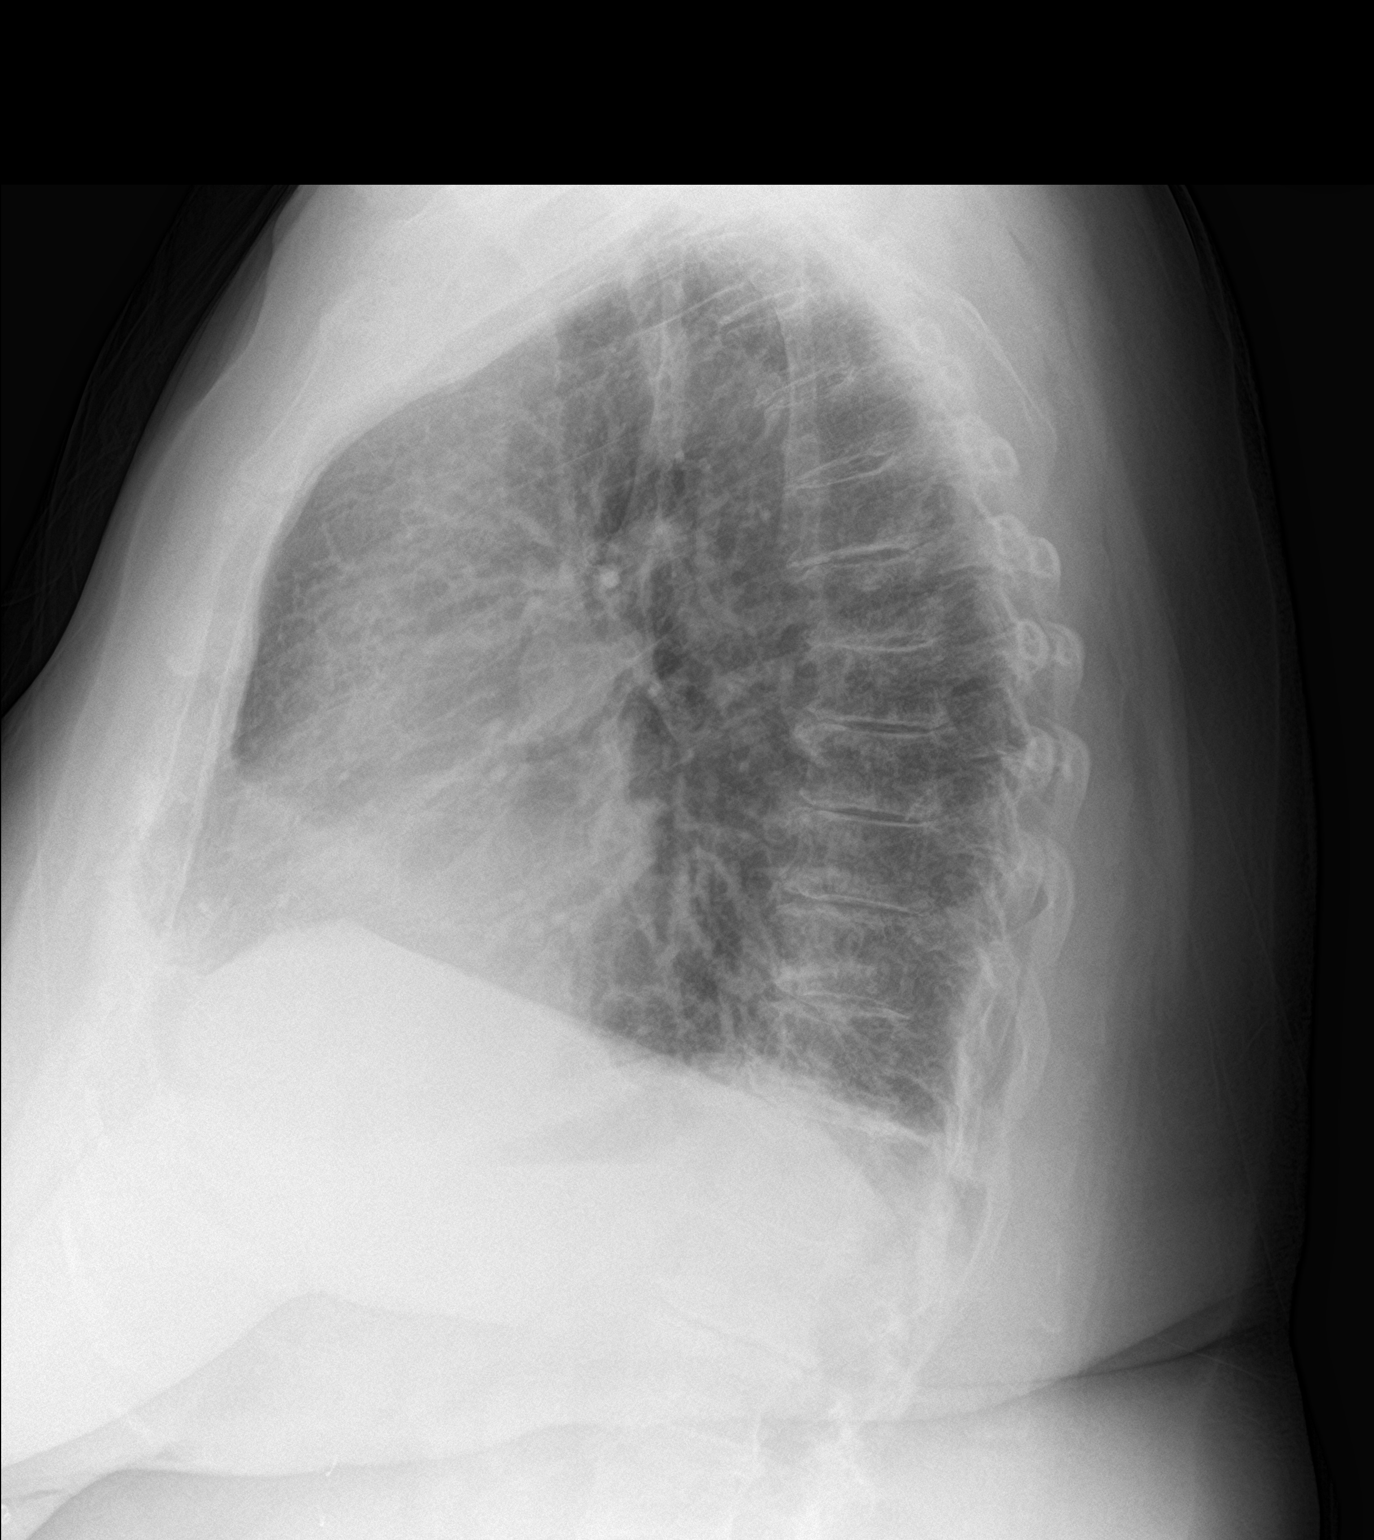

[2 of 2 positions shown; findings below may reference images not displayed]

FINDINGS: Prominent markings remain at the left lung base most consistent with
atelectasis or scarring. Pneumonia would be difficult to exclude. No
pleural effusion is seen. The right lung is clear. Mediastinal and
hilar contours are unremarkable. The heart is mildly enlarged and
stable. There are mild degenerative changes in the mid to lower
thoracic spine.
IMPRESSION: 1. Prominent markings again are noted at the left lung base most
consistent with atelectasis or scarring. Pneumonia cannot be
excluded.
2. Stable borderline cardiomegaly.

## 2019-01-03 ENCOUNTER — Other Ambulatory Visit: Payer: Self-pay | Admitting: General Surgery

## 2019-01-03 ENCOUNTER — Telehealth: Payer: Self-pay | Admitting: Internal Medicine

## 2019-01-03 MED ORDER — BUDESONIDE-FORMOTEROL FUMARATE 80-4.5 MCG/ACT IN AERO: 2.0000 | INHALATION_SPRAY | Freq: Two times a day (BID) | RESPIRATORY_TRACT | 0 refills | Status: DC

## 2019-01-03 NOTE — Telephone Encounter (Signed)
Unable to reach patient regarding Symbicort sample.No vmail pick up. Line rings then goes silent.  We are in limited supply for samples. Only one box only has been placed at front desk for pick up. Cannot guarantee this can be done again in the future as we are no longer receiving samples at this time.  Message routed to Raynelle Highland at [facility] to advise of this.

## 2019-01-03 NOTE — Telephone Encounter (Signed)
Patient called and was confused about where samples of Symbicort to be picked up. Patient was advised that a sample will be left up front for patient to pick up at [facility] office and will come by to pick it up. She was also advise that Pulmonology had samples in their office. Patient was now clear on where to get the medication being she was confused and calling Dr. Lucie Leather office.

## 2019-03-26 ENCOUNTER — Other Ambulatory Visit: Payer: Self-pay | Admitting: Adult Health

## 2019-05-01 ENCOUNTER — Other Ambulatory Visit: Payer: Self-pay | Admitting: Internal Medicine

## 2019-05-29 ENCOUNTER — Ambulatory Visit: Payer: Medicare Other | Admitting: Allergy and Immunology

## 2019-06-21 ENCOUNTER — Ambulatory Visit: Payer: Medicare Other | Admitting: Allergy and Immunology

## 2019-06-28 ENCOUNTER — Telehealth: Payer: Self-pay | Admitting: Internal Medicine

## 2019-06-28 NOTE — Telephone Encounter (Signed)
Called spoke with patient last seen 05/2018 no PFT on last AVS patient states her breathing is getting worse appt scheduled with Dr. Melvyn Novas Monday 07/03/19 at 1400.  Nothing further needed at this time.

## 2019-07-03 ENCOUNTER — Ambulatory Visit (INDEPENDENT_AMBULATORY_CARE_PROVIDER_SITE_OTHER): Payer: Medicare Other | Admitting: Internal Medicine

## 2019-07-03 ENCOUNTER — Ambulatory Visit (INDEPENDENT_AMBULATORY_CARE_PROVIDER_SITE_OTHER): Payer: Medicare Other

## 2019-07-03 ENCOUNTER — Encounter: Payer: Self-pay | Admitting: Internal Medicine

## 2019-07-03 ENCOUNTER — Other Ambulatory Visit: Payer: Self-pay

## 2019-07-03 VITALS — BP 134/86 | HR 86 | Temp 98.1°F | Ht 60.0 in | Wt 188.0 lb

## 2019-07-03 DIAGNOSIS — J479 Bronchiectasis, uncomplicated: Secondary | ICD-10-CM

## 2019-07-03 DIAGNOSIS — R059 Cough, unspecified: Secondary | ICD-10-CM

## 2019-07-03 DIAGNOSIS — R05 Cough: Secondary | ICD-10-CM | POA: Diagnosis not present

## 2019-07-03 DIAGNOSIS — J328 Other chronic sinusitis: Secondary | ICD-10-CM

## 2019-07-03 MED ORDER — BUDESONIDE-FORMOTEROL FUMARATE 80-4.5 MCG/ACT IN AERO: 2.0000 | INHALATION_SPRAY | Freq: Two times a day (BID) | RESPIRATORY_TRACT | 0 refills | Status: DC

## 2019-07-03 NOTE — Patient Instructions (Addendum)
Try prilosec  Take 30-60 min before first meal of the day and Pepcid ac (famotidine) 20 mg one after supper  Plan A = Automatic = Symbicort 80 Take 2 puffs first thing in am and then another 2 puffs about 12 hours later.   STOP  spiriva advair and fish oil for now   Work on inhaler technique:  relax and gently blow all the way out then take a nice smooth deep breath back in, triggering the inhaler at same time you start breathing in.  Hold for up to 5 seconds if you can. Blow out thru nose. Rinse and gargle with water when done      Plan B = Backup Only use your albuterol inhaler as a rescue medication to be used if you can't catch your breath by resting or doing a relaxed purse lip breathing pattern.  - The less you use it, the better it will work when you need it. - Ok to use the inhaler up to 2 puffs  every 4 hours if you must but call for appointment if use goes up over your usual need - Don't leave home without it !!  (think of it like the spare tire for your car)    Please schedule a follow up office visit in 2 weeks, call sooner if needed with all medications /inhalers/ solutions in hand so we can verify exactly what you are taking. This includes all medications from all doctors and over the [place] separate them into two bags:  the ones you take automatically, no matter what, vs the ones you take just when you feel you need them "BAG #2 is UP TO YOU"  - this will really help [place] help you take your medications more effectively.  >>> has one symb sample

## 2019-07-03 NOTE — Progress Notes (Signed)
Subjective:     Patient ID: Adriana White, female   DOB: 08/18/1946,    MRN: [id number]  Brief patient profile:  45 yowf  MM From [place] Quit smoking in 1986 when first and last baby born with baseline of 160 then around  Late  1990s  noted cough and eval by [facility] pulmonologist and eventually dx'd bronchiectasis placed  On advair seemed to help symtoms of sob/ cough initially but worse since around 12/2017 so self referred to pulmonary clinic 03/11/2018      History of Present Illness  03/11/2018 1st [facility] office visit/    Chief Complaint  Patient presents with  . Pulmonary Consult    Self referral. She states dxed with bronchiectasis approx 20 yrs ago. She c/o cough with tan sputum and increased SOB over the past 2 months. She states she gets SOB walking 1/2 a block.   pt with bronchiectasis worse cough x 2 months esp hs  With thick brown mucus    s assoc with nasal congestion and overt HB on fosfamax and ppi  but  no cp and never had hemoptysis with flares in past  Sob walking dog esp hills @ moderate pace  rec Stop advair and alendronate for now Augmentin 875 mg take one pill twice daily  X 10 days - take at breakfast and supper with large glass of water.  It would help reduce the usual side effects (diarrhea and yeast infections) if you ate cultured yogurt at lunch.  Symbicort 80 Take 2 puffs first thing in am and then another 2 puffs about 12 hours later.  Work on inhaler technique:  relax and gently blow all the way out then take a nice smooth deep breath back in, triggering the inhaler at same time you start breathing in.  Hold for up to 5 seconds if you can. Blow out thru nose. Rinse and gargle with water when done Change Omeprazole to take  20 mg x 30- 60 min before your first and last meals of the day  GERD diet  For cough/ congestion  > mucinex or mucinex dm up  To 1200 mg every 12 hours as needed Please schedule a follow up office visit in 2 weeks to see one our  NPS, sooner if needed  with all medications /inhalers/ solutions in hand so we can verify exactly what you are taking. This includes all medications from all doctors and over the counters - set up for pfts on return to see me in 6 weeks from now  - also needs f/u cxr and Ig's and alpha one screen      05/04/2018  f/u ov/ re: bronchiectasis  / did not bring meds / did not schedule pfts  Chief Complaint  Patient presents with  . Acute Visit    Pt c/o "stuffed up" and prod cough with tan sputum 6 days.     breathing is better, able to walk dog easier but cough no better and more brown mucus x one week prior to OV   Overusing saba  rec Symbicort 80 Take 2 puffs first thing in am and then another 2 puffs about 12 hours later.  Work on inhaler technique:  Change Omeprazole to take  20 mg x 30- 60 min before your first and last meals of the day  GERD (REFLUX)  is an extremely common cause of respiratory symptoms just like yours , many times with no obvious heartburn at all.  For cough/ congestion  >  mucinex or mucinex dm up  To 1200 mg every 12 hours as needed         06/15/2018  f/u ov/ re: bronchiectasis/ sinusitis  Chief Complaint  Patient presents with  . Follow-up    PFT's done today. Breathing has improved slightly- relates to using sinus rinse daily.    Dyspnea:  Walking dog / shopping ok  = MMRC2 = can't walk a nl pace on a flat grade s sob but does fine slow and flat e Cough: esp in am but also thruout the day, seening ent 06/17/18 > beige mucus Sleeping: flat  rec No change in medications and keep appt to see Dr [name] - if he feels allergies are an issue I will refer you to Dr [name] group Please schedule a follow up visit in 3 months but call sooner if needed  with all medications /inhalers/ solutions in hand so we can verify exactly what you are taking. This includes all medications from all doctors and over the counters     07/03/2019  f/u ov/ re: obst  bronchiectasis, sob and cough  Chief Complaint  Patient presents with  . Acute Visit    F/U breathing, increased SOB. Has several questions about breathing.   Dyspnea:  Room to room mostly limited though by coughing fits  Cough: coughs so hard can't catch her breath, assoc with nasal congest but no purulent secretions  Sleeping: one bed block ok  SABA use: not sure what inhalers she uses, thoroughly confused with details of care 02: no    No obvious day to day or daytime variability or assoc excess/ purulent sputum or mucus plugs or hemoptysis or cp or chest tightness, subjective wheeze or overt sinus or hb symptoms.    . Also denies any obvious fluctuation of symptoms with weather or environmental changes or other aggravating or alleviating factors except as outlined above   No unusual exposure hx or h/o childhood pna/ asthma or knowledge of premature birth.  Current Allergies, Complete Past Medical History, Past Surgical History, Family History, and Social History were reviewed in [employer] record.  ROS  The following are not active complaints unless bolded Hoarseness, sore throat, dysphagia, dental problems, itching, sneezing,  nasal congestion or discharge of excess mucus or purulent secretions, ear ache,   fever, chills, sweats, unintended wt loss or wt gain, classically pleuritic or exertional cp,  orthopnea pnd or arm/hand swelling  or leg swelling, presyncope, palpitations, abdominal pain, anorexia, nausea, vomiting, diarrhea  or change in bowel habits or change in bladder habits, change in stools or change in urine, dysuria, hematuria,  rash, arthralgias, visual complaints, headache, numbness, weakness or ataxia or problems with walking or coordination,  change in mood or  memory.        Current Meds-  - NOTE:   Unable to verify as accurately reflecting what pt takes     Medication Sig  . albuterol (PROVENTIL HFA;VENTOLIN HFA) 108 (90 Base) MCG/ACT inhaler  Inhale 2 puffs into the lungs every 4 (four) hours as needed.   Marland Kitchen albuterol (PROVENTIL HFA;VENTOLIN HFA) 108 (90 Base) MCG/ACT inhaler Inhale two puffs every four to six hours as needed for cough or wheeze.  Marland Kitchen ARIPiprazole (ABILIFY) 2 MG tablet Take 0.5 tablets by mouth daily.  Marland Kitchen aspirin 81 MG chewable tablet Chew 1 tablet by mouth daily.  Marland Kitchen atorvastatin (LIPITOR) 40 MG tablet Take 1 tablet by mouth daily.  . Biotin 1000 MCG tablet Take 1  tablet by mouth daily.  Marland Kitchen buPROPion (WELLBUTRIN XL) 150 MG 24 hr tablet Take 150 mg by mouth daily.  . Cholecalciferol (VITAMIN D3 PO) Take 2,000 Units by mouth daily.  . clonazePAM (KLONOPIN) 0.5 MG tablet Take 1 tablet by mouth daily as needed.  . DULoxetine (CYMBALTA) 60 MG capsule Take 1 capsule by mouth daily.  . famotidine (PEPCID) 40 MG tablet Take 1 tablet (40 mg total) by mouth at bedtime.  Marland Kitchen glucosamine-chondroitin 500-400 MG tablet Take 1 tablet by mouth daily.  . Hypertonic Nasal Wash (SINUS RINSE NA) Place into the nose daily.  Marland Kitchen levothyroxine (SYNTHROID, LEVOTHROID) 25 MCG tablet Take 1 tablet by mouth daily.  Marland Kitchen losartan (COZAAR) 25 MG tablet Take 1 tablet by mouth daily.  . montelukast (SINGULAIR) 10 MG tablet TAKE 1 TABLET BY MOUTH EVERYDAY AT BEDTIME  . nystatin (MYCOSTATIN/NYSTOP) powder APPLY TWICE DAILY AS NEEDED TO AFFECTED AREAS  . nystatin-triamcinolone ointment (MYCOLOG) APPLY TO AFFECTED AREA TWICE DAILY AS NEEDED  . Omega-3 1000 MG CAPS Take by mouth.  Marland Kitchen omeprazole (PRILOSEC) 40 MG capsule Take 1 capsule (40 mg total) by mouth every morning.  . Probiotic Product (VSL#3 DS PO) Take by mouth daily.  Marland Kitchen Respiratory Therapy Supplies (FLUTTER) DEVI Use as directed  . traZODone (DESYREL) 100 MG tablet Take 1 tablet by mouth at bedtime.                Objective:   Physical Exam  amb obese hoarse wf nad harsh barking cough with prominent pseudowheeze    07/03/2019        188  06/15/2018        180   05/04/18 183 lb 9.6 oz (83.3  kg)  03/31/18 189 lb 12.8 oz (86.1 kg)  03/11/18 193 lb (87.5 kg)    Vital signs reviewed - Note on arrival 02 sats  93% on RA      HEENT : pt wearing mask not removed for exam due to covid - 19 concerns.    NECK :  without JVD/Nodes/TM/ nl carotid upstrokes bilaterally   LUNGS: no acc muscle use,  Nl contour chest with mostly transmitted upper airway rhonchi  without cough on insp or exp maneuvers   CV:  RRR  no s3 or murmur or increase in P2, and no edema   ABD:  soft and nontender with nl inspiratory excursion in the supine position. No bruits or organomegaly appreciated, bowel sounds nl  MS:  Nl gait/ ext warm without deformities, calf tenderness, cyanosis or clubbing No obvious joint restrictions   SKIN: warm and dry without lesions    NEURO:  alert, approp, nl sensorium with  no motor or cerebellar deficits apparent.      CXR PA and Lateral:   07/03/2019 :    I personally reviewed images and  impression as follows:   No hyperinflation,mild coarse markings non-specific bilaterally - no as dz       Assessment:

## 2019-07-04 ENCOUNTER — Encounter: Payer: Self-pay | Admitting: Internal Medicine

## 2019-07-04 NOTE — Assessment & Plan Note (Signed)
CT sinus 05/27/18> 1. 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 2. Moderate mucosal thickening of the maxillary sinuses and the sphenoid sinuses. 3. Patent frontal sinus drainage pathways, sphenoid ethmoid recesses, and left ostiomeatal unit. 4. Opacified right ostiomeatal unit. 5. Moderate rightward nasal septal deviation>>  Referred to ENT > [name] 06/17/18 > did not return for f/u p 08/09/18  Consider referral back to ent once we have first accomplished med reconciliation and optimized medical rx

## 2019-07-04 NOTE — Assessment & Plan Note (Signed)
Onset of symptoms late 11s dx by [facility] 03/11/2018   rec try symbicort 80 2bid and stop fosfamax - Quant Ig's  05/19/18 nl  - Alpha One AT screen 05/19/18  MM/ level 144  - 05/04/2018     > continue symb 80 2bid  -HRCT chest 05/2018 >scattered bronchiectasis , scarring  - PFT's  06/15/2018  FEV1 1.46 (84 % ) ratio 75  p 0 % improvement from saba p symb 80 x 2  prior to study with DLCO  89/89 % corrects to 100  % for alv volume   - 07/03/2019  After extensive coaching inhaler device,  effectiveness =    75% > resume symbicort and d/c all others x for alb prn    Her problem is not so much airflow obst but rather uncontrolled coughing fits on advair for reasons unclear to me and thoroughly confused re details of care.   rec Focus on cough control with flutter valve Continue symb 80 2bid and prn saba  Max rx for gerd   F/u in 2 weeks with all meds in hand using a trust but verify approach to confirm accurate Medication  Reconciliation The principal here is that until we are certain that the  patients are doing what we've asked, it makes no sense to ask them to do more.    I had an extended discussion with the patient reviewing all relevant studies completed to date and  lasting 15 to 20 minutes of a 25 minute visit    I performed detailed device teaching using a teach back method which extended face to face time for this visit (see above)  Each maintenance medication was reviewed in detail including emphasizing most importantly the difference between maintenance and prns and under what circumstances the prns are to be triggered using an action plan format that is not reflected in the computer generated alphabetically organized AVS which I have not found useful in most complex patients, especially with respiratory illnesses  Please see AVS for specific instructions unique to this visit that I personally wrote and verbalized to the the pt in detail and then reviewed with pt  by my nurse  highlighting any  changes in therapy recommended at today's visit to their plan of care.

## 2019-07-04 NOTE — Progress Notes (Signed)
Spoke with pt and notified of results per Dr. Wert. Pt verbalized understanding and denied any questions. 

## 2019-07-04 NOTE — Assessment & Plan Note (Addendum)
Allergy profile 05/04/2018 >  Eos 0.5/  IgE 159   RAST  Pos ragweed/grass/ dust/trees/dog/ dust    Consider referral back  to [facility] next ov  (last seen 08/2018 ) and did not return as rec

## 2019-07-06 ENCOUNTER — Ambulatory Visit: Payer: Medicare Other | Admitting: Allergy and Immunology

## 2019-07-17 ENCOUNTER — Ambulatory Visit: Payer: Medicare Other | Admitting: Internal Medicine

## 2019-07-19 DIAGNOSIS — R7303 Prediabetes: Secondary | ICD-10-CM | POA: Insufficient documentation

## 2019-07-31 ENCOUNTER — Telehealth: Payer: Self-pay | Admitting: Internal Medicine

## 2019-07-31 ENCOUNTER — Telehealth: Payer: Self-pay

## 2019-07-31 NOTE — Telephone Encounter (Signed)
Patient doesn't have prescription coverage and she is wondering if we have any samples of Symbicort. Patient is unsure of the strength.    Please Advise

## 2019-07-31 NOTE — Telephone Encounter (Signed)
Pt will call Dr. [name] for symbicort -pr

## 2019-07-31 NOTE — Telephone Encounter (Signed)
Called and left detailed per DPR admission advising that we can give 1 sample of Symbicort but that the patient is due for an OV and needs to make their appt in order to receive further samples or refills of Symbicort.

## 2019-08-01 NOTE — Telephone Encounter (Signed)
Pt informed that Symbicort sample available. Ov scheduled.

## 2019-08-03 ENCOUNTER — Ambulatory Visit: Payer: Medicare Other | Admitting: Internal Medicine

## 2019-08-08 ENCOUNTER — Encounter: Payer: Self-pay | Admitting: Internal Medicine

## 2019-08-08 ENCOUNTER — Other Ambulatory Visit: Payer: Self-pay

## 2019-08-08 ENCOUNTER — Ambulatory Visit (INDEPENDENT_AMBULATORY_CARE_PROVIDER_SITE_OTHER): Payer: Medicare Other | Admitting: Internal Medicine

## 2019-08-08 DIAGNOSIS — J479 Bronchiectasis, uncomplicated: Secondary | ICD-10-CM | POA: Diagnosis not present

## 2019-08-08 DIAGNOSIS — J328 Other chronic sinusitis: Secondary | ICD-10-CM | POA: Diagnosis not present

## 2019-08-08 DIAGNOSIS — Z23 Encounter for immunization: Secondary | ICD-10-CM | POA: Diagnosis not present

## 2019-08-08 NOTE — Patient Instructions (Addendum)
Try off symbicort for a week or two if worse use symbicort 160  Take 2 puffs first thing in am and then another 2 puffs about 12 hours later.     Work on inhaler technique:  relax and gently blow all the way out then take a nice smooth deep breath back in, triggering the inhaler at same time you start breathing in.  Hold for up to 5 seconds if you can. Blow out thru nose. Rinse and gargle with water when done      Please schedule a follow up visit in 3 months but call sooner if needed  Add: ???  never completed ent rx > refer back if not better = next step

## 2019-08-08 NOTE — Progress Notes (Signed)
Subjective:     Patient ID: Adriana White, female   DOB: Feb 26, 1946,    MRN: [id number]  Brief patient profile:  76 yowf  MM From [place] Quit smoking in 1986 when first and last baby born with baseline of 160 then around  Late  1990s  noted cough and eval by [facility] pulmonologist and eventually dx'd bronchiectasis placed  On advair seemed to help symtoms of sob/ cough initially but worse since around 12/2017 so self referred to pulmonary clinic 03/11/2018      History of Present Illness  03/11/2018 1st [facility] office visit/    Chief Complaint  Patient presents with  . Pulmonary Consult    Self referral. She states dxed with bronchiectasis approx 20 yrs ago. She c/o cough with tan sputum and increased SOB over the past 2 months. She states she gets SOB walking 1/2 a block.   pt with bronchiectasis worse cough x 2 months esp hs  With thick brown mucus    s assoc with nasal congestion and overt HB on fosfamax and ppi  but  no cp and never had hemoptysis with flares in past  Sob walking dog esp hills @ moderate pace  rec Stop advair and alendronate for now Augmentin 875 mg take one pill twice daily  X 10 days - take at breakfast and supper with large glass of water.  It would help reduce the usual side effects (diarrhea and yeast infections) if you ate cultured yogurt at lunch.  Symbicort 80 Take 2 puffs first thing in am and then another 2 puffs about 12 hours later.  Work on inhaler technique:  relax and gently blow all the way out then take a nice smooth deep breath back in, triggering the inhaler at same time you start breathing in.  Hold for up to 5 seconds if you can. Blow out thru nose. Rinse and gargle with water when done Change Omeprazole to take  20 mg x 30- 60 min before your first and last meals of the day  GERD diet  For cough/ congestion  > mucinex or mucinex dm up  To 1200 mg every 12 hours as needed Please schedule a follow up office visit in 2 weeks to see one our  NPS, sooner if needed  with all medications /inhalers/ solutions in hand so we can verify exactly what you are taking. This includes all medications from all doctors and over the counters - set up for pfts on return to see me in 6 weeks from now  - also needs f/u cxr and Ig's and alpha one screen      05/04/2018  f/u ov/ re: bronchiectasis  / did not bring meds / did not schedule pfts  Chief Complaint  Patient presents with  . Acute Visit    Pt c/o "stuffed up" and prod cough with tan sputum 6 days.     breathing is better, able to walk dog easier but cough no better and more brown mucus x one week prior to OV   Overusing saba  rec Symbicort 80 Take 2 puffs first thing in am and then another 2 puffs about 12 hours later.  Work on inhaler technique:  Change Omeprazole to take  20 mg x 30- 60 min before your first and last meals of the day  GERD (REFLUX)  is an extremely common cause of respiratory symptoms just like yours , many times with no obvious heartburn at all.  For cough/ congestion  >  mucinex or mucinex dm up  To 1200 mg every 12 hours as needed         06/15/2018  f/u ov/ re: bronchiectasis/ sinusitis  Chief Complaint  Patient presents with  . Follow-up    PFT's done today. Breathing has improved slightly- relates to using sinus rinse daily.    Dyspnea:  Walking dog / shopping ok  = MMRC2 = can't walk a nl pace on a flat grade s sob but does fine slow and flat e Cough: esp in am but also thruout the day, seening ent 06/17/18 > beige mucus Sleeping: flat  rec No change in medications and keep appt to see Dr [name] - if he feels allergies are an issue I will refer you to Dr [name] group        07/03/2019  f/u ov/ re: obst bronchiectasis, sob and cough  Chief Complaint  Patient presents with  . Acute Visit    F/U breathing, increased SOB. Has several questions about breathing.   Dyspnea:  Room to room mostly limited though by coughing fits  Cough: coughs so  hard can't catch her breath, assoc with nasal congest but no purulent secretions  Sleeping: one bed block ok  SABA use: not sure what inhalers she uses, thoroughly confused with details of care 02: no  rec Try prilosec  Take 30-60 min before first meal of the day and Pepcid ac (famotidine) 20 mg one after supper Plan A = Automatic = Symbicort 80 Take 2 puffs first thing in am and then another 2 puffs about 12 hours later.  STOP  spiriva advair and fish oil for now  Work on inhaler technique:   Plan B = Backup Only use your albuterol inhaler as a rescue medication Please schedule a follow up office visit in 2 weeks, call sooner if needed with all medications /inhalers/ solutions in hand so we can verify exactly what you are taking. This includes all medications from all doctors and over the [place] separate them into two bags:  the ones you take automatically, no matter what, vs the ones you take just when you feel you need them "BAG #2 is UP TO YOU"  - this will really help [place] help you take your medications more effectively.  >>> has one symb sample    08/08/2019  f/u ov/ re: obst bronchiectasis on symb 80 2bid poor adherence (did not use sample up yet and having trouble affording rx)  Chief Complaint  Patient presents with  . Follow-up    Breathing is about the same. She rarely uses her albuterol.  Dyspnea:  Walking dog around neighborhood some hills  Cough: better but still aggravated at hs for just a few min then sleeps fine Sleeping: able to sleep on bed blocks  SABA use: rarely  02: no    No obvious day to day or daytime variability or assoc excess/ purulent sputum or mucus plugs or hemoptysis or cp or chest tightness, subjective wheeze or overt sinus or hb symptoms.   Sleeping as above without nocturnal  or early am exacerbation  of respiratory  c/o's or need for noct saba. Also denies any obvious fluctuation of symptoms with weather or environmental changes or other  aggravating or alleviating factors except as outlined above   No unusual exposure hx or h/o childhood pna/ asthma or knowledge of premature birth.  Current Allergies, Complete Past Medical History, Past Surgical History, Family History, and Social History were reviewed in  [facility] electronic medical record.  ROS  The following are not active complaints unless bolded Hoarseness, sore throat, dysphagia, dental problems, itching, sneezing,  nasal congestion or discharge of excess mucus or purulent secretions, ear ache,   fever, chills, sweats, unintended wt loss or wt gain, classically pleuritic or exertional cp,  orthopnea pnd or arm/hand swelling  or leg swelling, presyncope, palpitations, abdominal pain, anorexia, nausea, vomiting, diarrhea  or change in bowel habits or change in bladder habits, change in stools or change in urine, dysuria, hematuria,  rash, arthralgias, visual complaints, headache, numbness, weakness or ataxia or problems with walking or coordination,  change in mood or  memory.        Current Meds  Medication Sig  . albuterol (PROVENTIL HFA;VENTOLIN HFA) 108 (90 Base) MCG/ACT inhaler Inhale two puffs every four to six hours as needed for cough or wheeze.  Marland Kitchen ARIPiprazole (ABILIFY) 2 MG tablet Take 0.5 tablets by mouth daily.  Marland Kitchen aspirin 81 MG chewable tablet Chew 1 tablet by mouth daily.  Marland Kitchen atorvastatin (LIPITOR) 40 MG tablet Take 20 tablets by mouth daily.   . Biotin 1000 MCG tablet Take 1 tablet by mouth daily.  . budesonide-formoterol (SYMBICORT) 80-4.5 MCG/ACT inhaler Inhale 2 puffs into the lungs 2 (two) times daily.  Marland Kitchen buPROPion (WELLBUTRIN XL) 150 MG 24 hr tablet Take 150 mg by mouth daily.  . chlorpheniramine (CHLOR-TRIMETON) 4 MG tablet Take 4 mg by mouth every 4 (four) hours as needed for allergies.  . Cholecalciferol (VITAMIN D3 PO) Take 2,000 Units by mouth daily.  . clonazePAM (KLONOPIN) 0.5 MG tablet Take 1 tablet by mouth daily as needed.  .  diphenoxylate-atropine (LOMOTIL) 2.5-0.025 MG tablet Take 1 tablet by mouth 2 (two) times daily.  . DULoxetine (CYMBALTA) 60 MG capsule Take 1 capsule by mouth daily.  Marland Kitchen glucosamine-chondroitin 500-400 MG tablet Take 1 tablet by mouth daily.  . Hypertonic Nasal Wash (SINUS RINSE NA) Place into the nose daily.  Marland Kitchen levothyroxine (SYNTHROID, LEVOTHROID) 25 MCG tablet Take 1 tablet by mouth daily.  Marland Kitchen losartan (COZAAR) 25 MG tablet Take 1 tablet by mouth daily.  . montelukast (SINGULAIR) 10 MG tablet TAKE 1 TABLET BY MOUTH EVERYDAY AT BEDTIME  . nystatin (MYCOSTATIN/NYSTOP) powder APPLY TWICE DAILY AS NEEDED TO AFFECTED AREAS  . nystatin-triamcinolone ointment (MYCOLOG) APPLY TO AFFECTED AREA TWICE DAILY AS NEEDED  . omeprazole (PRILOSEC) 20 MG capsule Take 40 mg by mouth daily.  . Probiotic Product (VSL#3 DS PO) Take by mouth daily.  Marland Kitchen Respiratory Therapy Supplies (FLUTTER) DEVI Use as directed  . traZODone (DESYREL) 100 MG tablet Take 1 tablet by mouth at bedtime.                   Objective:   Physical Exam  amb obese wf harsh cough   08/08/2019      191  07/03/2019        188  06/15/2018        180   05/04/18 183 lb 9.6 oz (83.3 kg)  03/31/18 189 lb 12.8 oz (86.1 kg)  03/11/18 193 lb (87.5 kg)      Vital signs reviewed - Note on arrival 02 sats  95% on RA       HEENT : pt wearing mask not removed for exam due to covid -19 concerns.    NECK :  without JVD/Nodes/TM/ nl carotid upstrokes bilaterally   LUNGS: no acc muscle use,  Nl contour chest with minimal insp/exp rhonchi  bilaterally without cough on insp or exp maneuvers   CV:  RRR  no s3 or murmur or increase in P2, and no edema   ABD:  soft and nontender with nl inspiratory excursion in the supine position. No bruits or organomegaly appreciated, bowel sounds nl  MS:  Nl gait/ ext warm without deformities, calf tenderness, cyanosis or clubbing No obvious joint restrictions   SKIN: warm and dry without lesions     NEURO:  alert, approp, nl sensorium with  no motor or cerebellar deficits apparent.             Assessment:

## 2019-08-09 ENCOUNTER — Encounter: Payer: Self-pay | Admitting: Internal Medicine

## 2019-08-09 ENCOUNTER — Telehealth: Payer: Self-pay | Admitting: *Deleted

## 2019-08-09 NOTE — Assessment & Plan Note (Signed)
Onset of symptoms late 85s dx by [facility] 03/11/2018   rec try symbicort 80 2bid and stop fosfamax - Quant Ig's  05/19/18 nl  - [facility] AT screen 05/19/18  MM/ level 144  - 05/04/2018     > continue symb 80 2bid  -HRCT chest 05/2018 >scattered bronchiectasis , scarring  - PFT's  06/15/2018  FEV1 1.46 (84 % ) ratio 75  p 0 % improvement from saba p symb 80 x 2  prior to study with DLCO  89/89 % corrects to 100  % for alv volume   - 07/03/2019    resume symbicort and d/c all others x for alb prn - 08/08/2019  After extensive coaching inhaler device,  effectiveness =    75% (short Ti) > try off to be sure it's helping and just use prn    Not really clear symbicort helping and not using correclty/ consistently anyway and having trouble affording it on her plan so reasonable to try off to see if really makes any difference in her symptoms and if not just use prn flare  Based on two studies from NEJM  378; 20 p 1865 (2018) and 380 : p2020-30 (2019) in pts with mild asthma (and by inference obst bronchiectasis)  it is reasonable to use low dose symbicort eg 80 2bid "prn" flare in this setting but I emphasized this was only shown with symbicort and takes advantage of the rapid onset of action but is not the same as "rescue therapy" but can be stopped once the acute symptoms have resolved and the need for rescue has been minimized (< 2 x weekly)      I had an extended discussion with the patient reviewing all relevant studies completed to date and  lasting 15 to 20 minutes of a 25 minute visit    I performed detailed device teaching using a teach back method which extended face to face time for this visit (see above)  Each maintenance medication was reviewed in detail including emphasizing most importantly the difference between maintenance and prns and under what circumstances the prns are to be triggered using an action plan format that is not reflected in the computer generated alphabetically  organized AVS which I have not found useful in most complex patients, especially with respiratory illnesses  Please see AVS for specific instructions unique to this visit that I personally wrote and verbalized to the the pt in detail and then reviewed with pt  by my nurse highlighting any  changes in therapy recommended at today's visit to their plan of care.

## 2019-08-09 NOTE — Telephone Encounter (Signed)
LMTCB

## 2019-08-09 NOTE — Assessment & Plan Note (Addendum)
CT sinus 05/27/18  1. 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 2. Moderate mucosal thickening of the maxillary sinuses and the sphenoid sinuses. 3. Patent frontal sinus drainage pathways, sphenoid ethmoid recesses, and left ostiomeatal unit. 4. Opacified right ostiomeatal unit. 5. Moderate rightward nasal septal deviation>>  Referred to ENT > Adriana White 06/17/18 > rec surgery ? Ever done   Low threshold to return to ent if cough worse

## 2019-08-09 NOTE — Telephone Encounter (Signed)
-----   Message from Tanda Rockers, MD sent at 08/09/2019  7:00 AM EDT ----- Find out if she had sinus surgery by [name] last year - I see where it was planned and never done and suggest if that's the case she see byers if cough continues to bother her as likely sinus dz contributing

## 2019-08-16 NOTE — Telephone Encounter (Signed)
LMTCB

## 2019-08-17 NOTE — Telephone Encounter (Signed)
Patient is returning phone call.  Patient phone number is [phone].

## 2019-08-17 NOTE — Telephone Encounter (Signed)
LVMTCB x 1 for patient regarding information from Dr. Melvyn Novas noted below.

## 2019-08-22 ENCOUNTER — Other Ambulatory Visit: Payer: Self-pay

## 2019-08-22 ENCOUNTER — Ambulatory Visit (INDEPENDENT_AMBULATORY_CARE_PROVIDER_SITE_OTHER): Payer: Medicare Other | Admitting: Allergy and Immunology

## 2019-08-22 ENCOUNTER — Encounter: Payer: Self-pay | Admitting: Allergy and Immunology

## 2019-08-22 VITALS — BP 138/86 | HR 92 | Temp 97.9°F | Resp 20 | Ht <= 58 in | Wt 192.2 lb

## 2019-08-22 DIAGNOSIS — J4550 Severe persistent asthma, uncomplicated: Secondary | ICD-10-CM

## 2019-08-22 DIAGNOSIS — D7219 Other eosinophilia: Secondary | ICD-10-CM

## 2019-08-22 DIAGNOSIS — J324 Chronic pansinusitis: Secondary | ICD-10-CM

## 2019-08-22 DIAGNOSIS — J3089 Other allergic rhinitis: Secondary | ICD-10-CM

## 2019-08-22 DIAGNOSIS — J479 Bronchiectasis, uncomplicated: Secondary | ICD-10-CM

## 2019-08-22 DIAGNOSIS — K219 Gastro-esophageal reflux disease without esophagitis: Secondary | ICD-10-CM

## 2019-08-22 MED ORDER — MONTELUKAST SODIUM 10 MG PO TABS: ORAL_TABLET | ORAL | 0 refills | Status: DC

## 2019-08-22 MED ORDER — FAMOTIDINE 40 MG PO TABS: 40.0000 mg | ORAL_TABLET | Freq: Every day | ORAL | 1 refills | Status: DC

## 2019-08-22 MED ORDER — BUDESONIDE-FORMOTEROL FUMARATE 80-4.5 MCG/ACT IN AERO: 2.0000 | INHALATION_SPRAY | Freq: Two times a day (BID) | RESPIRATORY_TRACT | 5 refills | Status: DC

## 2019-08-22 MED ORDER — MONTELUKAST SODIUM 10 MG PO TABS: ORAL_TABLET | ORAL | 1 refills | Status: DC

## 2019-08-22 MED ORDER — BENRALIZUMAB 30 MG/ML ~~LOC~~ SOSY
30.0000 mg | PREFILLED_SYRINGE | Freq: Once | SUBCUTANEOUS | Status: AC
  Administered 2019-08-22: 30 mg via SUBCUTANEOUS

## 2019-08-22 MED ORDER — OMEPRAZOLE 20 MG PO CPDR: 40.0000 mg | DELAYED_RELEASE_CAPSULE | Freq: Every morning | ORAL | 1 refills | Status: DC

## 2019-08-22 MED ORDER — EPINEPHRINE 0.3 MG/0.3ML IJ SOAJ: 0.3000 mg | Freq: Once | INTRAMUSCULAR | 1 refills | Status: AC

## 2019-08-22 MED ORDER — BUDESONIDE-FORMOTEROL FUMARATE 160-4.5 MCG/ACT IN AERO: 2.0000 | INHALATION_SPRAY | Freq: Two times a day (BID) | RESPIRATORY_TRACT | 5 refills | Status: DC

## 2019-08-22 NOTE — Progress Notes (Signed)
[place]   Follow-up Note  Referring Provider: Derinda SisAyers, Catherine Anne, * Primary Provider: Derinda SisAyers, Catherine Anne, MD Date of Office Visit: 08/22/2019  Subjective:   Adriana White (DOB: January 20, 1946) is a 73 y.o. 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  I last saw her in his clinic during her initial evaluation 12 September 2018.  She never returned for a follow-up visit.  She basically has had a year of recurrent respiratory tract symptoms with coughing and wheezing and nasal congestion and head fullness that waxes and wanes and may have required a systemic steroid and may have required an antibiotic during this timeframe.  She is intermittently using her medications as she feels overwhelmed with life in general and has a very difficult time getting on a set schedule to use medications.  Her reflux is under good control at this point.  She never had sinus surgery performed by Dr. Jearld FentonByers as she had planned in December 2019.  Allergies as of 08/22/2019      Reactions   Olsalazine Rash   Other reaction(s): Other (See Comments) unknown Pt. Not sure of reaction Pt. Not sure of reaction   Sulfamethoxazole Rash   Patient not sure why. Patient not sure why.      Medication List      albuterol 108 (90 Base) MCG/ACT inhaler Commonly known as: VENTOLIN HFA Inhale two puffs every four to six hours as needed for cough or wheeze.   ARIPiprazole 2 MG tablet Commonly known as: ABILIFY Take 0.5 tablets by mouth daily.   aspirin 81 MG chewable tablet Chew 1 tablet by mouth daily.   atorvastatin 40 MG tablet Commonly known as: LIPITOR Take 20 tablets by mouth daily.   Biotin 1000 MCG tablet Take 1 tablet by mouth daily.   budesonide-formoterol 80-4.5  MCG/ACT inhaler Commonly known as: Symbicort Inhale 2 puffs into the lungs 2 (two) times daily.   buPROPion 150 MG 24 hr tablet Commonly known as: WELLBUTRIN XL Take 150 mg by mouth daily.   chlorpheniramine 4 MG tablet Commonly known as: CHLOR-TRIMETON Take 4 mg by mouth every 4 (four) hours as needed for allergies.   clonazePAM 0.5 MG tablet Commonly known as: KLONOPIN Take 1 tablet by mouth daily as needed.   diphenoxylate-atropine 2.5-0.025 MG tablet Commonly known as: LOMOTIL Take 1 tablet by mouth 2 (two) times daily.   DULoxetine 60 MG capsule Commonly known as: CYMBALTA Take 1 capsule by mouth daily.   Flutter Devi Use as directed   glucosamine-chondroitin 500-400 MG tablet Take 1 tablet by mouth daily.   levothyroxine 25 MCG tablet Commonly known as: SYNTHROID Take 1 tablet by mouth daily.   losartan 25 MG tablet Commonly known as: COZAAR Take 1 tablet by mouth daily.   montelukast 10 MG tablet Commonly known as: SINGULAIR TAKE 1 TABLET BY MOUTH EVERYDAY AT BEDTIME   nystatin powder Commonly known as: MYCOSTATIN/NYSTOP APPLY TWICE DAILY AS NEEDED TO AFFECTED AREAS   nystatin-triamcinolone ointment Commonly known as: MYCOLOG APPLY TO AFFECTED AREA TWICE DAILY AS NEEDED   omeprazole 20 MG capsule Commonly known as: PRILOSEC Take 40 mg by mouth daily.   SINUS RINSE NA Place into the nose daily.   traZODone 100 MG tablet Commonly known as: DESYREL Take 1 tablet  by mouth at bedtime.   VITAMIN D3 PO Take 2,000 Units by mouth daily.   VSL#3 DS PO Take by mouth daily.       Past Medical History:  Diagnosis Date  . Asthma   . Bronchiectasis ([place])   . Fatty liver   . Hyperlipidemia   . Hypertension   . OSA (obstructive sleep apnea)   . Ulcerative colitis [facility])     Past Surgical History:  Procedure Laterality Date  . ABDOMINAL HERNIA REPAIR    . ABDOMINAL PERINEAL BOWEL RESECTION    . CESAREAN SECTION    . TONSILLECTOMY    . UTERINE  FIBROID SURGERY      Review of systems negative except as noted in HPI / PMHx or noted below:  Review of Systems  Constitutional: Negative.   HENT: Negative.   Eyes: Negative.   Respiratory: Negative.   Cardiovascular: Negative.   Gastrointestinal: Negative.   Genitourinary: Negative.   Musculoskeletal: Negative.   Skin: Negative.   Neurological: Negative.   Endo/Heme/Allergies: Negative.   Psychiatric/Behavioral: Negative.      Objective:   Vitals:   08/22/19 1535  BP: 138/86  Pulse: 92  Resp: 20  Temp: 97.9 F (36.6 C)  SpO2: 92%   Height: 4' 9.8" (146.8 cm)  Weight: 192 lb 3.2 oz (87.2 kg)   Physical Exam Constitutional:      Appearance: She is not diaphoretic.  HENT:     Head: Normocephalic.     Right Ear: Tympanic membrane, ear canal and external ear normal.     Left Ear: Tympanic membrane, ear canal and external ear normal.     Nose: Nose normal. No mucosal edema or rhinorrhea.     Mouth/Throat:     Pharynx: Uvula midline. No oropharyngeal exudate.  Eyes:     Conjunctiva/sclera: Conjunctivae normal.  Neck:     Thyroid: No thyromegaly.     Trachea: Trachea normal. No tracheal tenderness or tracheal deviation.  Cardiovascular:     Rate and Rhythm: Normal rate and regular rhythm.     Heart sounds: Normal heart sounds, S1 normal and S2 normal. No murmur.  Pulmonary:     Effort: No respiratory distress.     Breath sounds: No stridor. Wheezing (Scattered expiratory wheezes all lung fields) present. No rales.  Lymphadenopathy:     Head:     Right side of head: No tonsillar adenopathy.     Left side of head: No tonsillar adenopathy.     Cervical: No cervical adenopathy.  Skin:    Findings: No erythema or rash.     Nails: There is no clubbing.   Neurological:     Mental Status: She is alert.     Diagnostics:    Spirometry was performed and demonstrated an FEV1 of 1.02 at 79 % of predicted.  The patient had an Asthma Control Test with the  following results: ACT Total Score: 19.    Assessment and Plan:   1. Not well controlled severe persistent asthma   2. Obstructive bronchiectasis ([place])   3. Chronic pansinusitis   4. Perennial allergic rhinitis   5. LPRD (laryngopharyngeal reflux disease)   6. Other eosinophilia     1.  Allergen avoidance measures -house dust mite, dog  2.  Treat and prevent inflammation:   A.  Symbicort 160-2 inhalations twice a day with spacer  B.  OTC Nasacort- 2 sprays each nostril 1 time per day  D.  Montelukast 10 mg - 1  tablet 1 time per day  3.  Treat and prevent reflux:   A.  Minimize use of caffeine and slowly taper  B.  Omeprazole 40 mg tablet in a.m.  C.  Famotidine 40 mg tablet in p.m.  4.  If needed:   A.  Albuterol MDI-2 inhalations every 4-6 hours  B.  Nasal saline  C.  OTC antihistamine  5.  Submit to insurance company for anti-IL-5 biologic agent for eosinophilia.  Sample today  6.  Obtain flu vaccine and Covid vaccine when available  7.  Return to December 2020 or earlier if problem  We will start Rahima on a anti- IL5 biological agent today.  Hopefully this is going to result in significant improvement for both her upper and lower airways.  At this point I encouraged her to consistently use therapy directed against respiratory tract inflammation with the medications noted above although she has a very difficult time using medications on a consistent basis.  Fortunately, her reflux appears to be uncontrolled under good control with her current therapy.  I will see her back in his clinic in a month or so.  Laurette Schimke, MD Allergy / Immunology [facility]

## 2019-08-22 NOTE — Patient Instructions (Addendum)
  1.  Allergen avoidance measures -house dust mite, dog  2.  Treat and prevent inflammation:   A.  Symbicort 160-2 inhalations twice a day with spacer  B.  OTC Nasacort- 2 sprays each nostril 1 time per day  D.  Montelukast 10 mg - 1 tablet 1 time per day  3.  Treat and prevent reflux:   A.  Minimize use of caffeine and slowly taper  B.  Omeprazole 40 mg tablet in a.m.  C.  Famotidine 40 mg tablet in p.m.  4.  If needed:   A.  Albuterol MDI-2 inhalations every 4-6 hours  B.  Nasal saline  C.  OTC antihistamine  5.  Submit to insurance company for anti-IL-5 biologic agent for eosinophilia.  Sample today  6.  Obtain flu vaccine and Covid vaccine when available  7.  Return to December 2020 or earlier if problem

## 2019-08-23 ENCOUNTER — Encounter: Payer: Self-pay | Admitting: Allergy and Immunology

## 2019-08-24 ENCOUNTER — Telehealth: Payer: Self-pay | Admitting: Internal Medicine

## 2019-08-24 NOTE — Telephone Encounter (Signed)
Left message for patient to call back  

## 2019-08-24 NOTE — Telephone Encounter (Signed)
Pt returning call

## 2019-08-24 NOTE — Telephone Encounter (Signed)
LMTCB

## 2019-08-24 NOTE — Telephone Encounter (Signed)
-----   Message from Tanda Rockers, MD sent at 08/09/2019  7:00 AM EDT ----- Find out if she had sinus surgery by [name] last year - I see where it was planned and never done and suggest if that's the case she see byers if cough continues to bother her as likely sinus dz contributing  Spoke with the pt and notified of recs per MW  She states never had the surgery  Will call [name] for f/u

## 2019-09-01 ENCOUNTER — Telehealth: Payer: Self-pay | Admitting: *Deleted

## 2019-09-01 NOTE — Telephone Encounter (Signed)
Patient received sample in clinic with Fasenra on 08/22/19 have L/m 11/4 and 11/13 for patient to contact me so I can explain how there medication will be buy and bill as long as she keeps same policy.

## 2019-09-05 ENCOUNTER — Other Ambulatory Visit: Payer: Self-pay | Admitting: *Deleted

## 2019-09-05 MED ORDER — EPINEPHRINE 0.3 MG/0.3ML IJ SOAJ: 0.3000 mg | INTRAMUSCULAR | 1 refills | Status: AC | PRN

## 2019-09-06 NOTE — Telephone Encounter (Signed)
Spoke to patient and advised as long as she has MCR and supplement we will buy and bill rx and that way it will not cost her out of pocket. Patient understood

## 2019-09-19 ENCOUNTER — Ambulatory Visit: Payer: Medicare Other | Admitting: Allergy and Immunology

## 2019-09-19 ENCOUNTER — Ambulatory Visit: Payer: Self-pay

## 2019-09-25 ENCOUNTER — Ambulatory Visit: Payer: Self-pay

## 2019-10-09 DIAGNOSIS — J4550 Severe persistent asthma, uncomplicated: Secondary | ICD-10-CM | POA: Diagnosis not present

## 2019-10-10 ENCOUNTER — Ambulatory Visit (INDEPENDENT_AMBULATORY_CARE_PROVIDER_SITE_OTHER): Payer: Medicare Other | Admitting: Allergy and Immunology

## 2019-10-10 ENCOUNTER — Other Ambulatory Visit: Payer: Self-pay

## 2019-10-10 ENCOUNTER — Encounter: Payer: Self-pay | Admitting: Allergy and Immunology

## 2019-10-10 VITALS — BP 142/80 | HR 81 | Temp 97.9°F | Resp 16

## 2019-10-10 DIAGNOSIS — J324 Chronic pansinusitis: Secondary | ICD-10-CM

## 2019-10-10 DIAGNOSIS — J4550 Severe persistent asthma, uncomplicated: Secondary | ICD-10-CM

## 2019-10-10 DIAGNOSIS — J479 Bronchiectasis, uncomplicated: Secondary | ICD-10-CM

## 2019-10-10 DIAGNOSIS — K219 Gastro-esophageal reflux disease without esophagitis: Secondary | ICD-10-CM

## 2019-10-10 DIAGNOSIS — J3089 Other allergic rhinitis: Secondary | ICD-10-CM | POA: Diagnosis not present

## 2019-10-10 DIAGNOSIS — D7219 Other eosinophilia: Secondary | ICD-10-CM

## 2019-10-10 MED ORDER — BENRALIZUMAB 30 MG/ML ~~LOC~~ SOSY
30.0000 mg | PREFILLED_SYRINGE | Freq: Once | SUBCUTANEOUS | Status: AC
  Administered 2019-10-10: 30 mg via SUBCUTANEOUS

## 2019-10-10 NOTE — Progress Notes (Signed)
[place]   Follow-up Note  Referring Provider: Perrin Smack, *  Primary Provider: Perrin Smack, MD Date of Office Visit: 10/10/2019  Subjective:   Adriana White (DOB: [birth year]) is a 73 y.o. 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  Her last visit to this clinic was 22 August 2019.  At that point in time we started her on benralizumab injections.  Overall she thinks that she is doing better.  She has had less cough and less phlegm production but she still does cough and still makes phlegm.  She has had no need to use a short acting bronchodilator.  She has had no issues with her nose and she can smell without any problem.  She believes that her reflux is under control.  For some reason, which is poorly explained, which may be tied up with a poor memory issue currently being evaluated with a sleep study, she is not using her Symbicort and she is not using her nasal steroid and she only had 1 injection of benralizumab and did not follow-up for another injection.  The dog is now outside of the bedroom.  She has placed encasements on her pillow and mattress.  She did obtain the flu vaccine.  Allergies as of 10/10/2019      Reactions   Olsalazine Rash   Other reaction(s): Other (See Comments) unknown Pt. Not sure of reaction Pt. Not sure of reaction   Sulfamethoxazole Rash   Patient not sure why. Patient not sure why.      Medication List    albuterol 108 (90 Base) MCG/ACT inhaler Commonly known as: VENTOLIN HFA Inhale two puffs every four to six hours as needed for cough or wheeze.   aspirin 81 MG chewable tablet Chew 1 tablet by mouth  daily.   atorvastatin 40 MG tablet Commonly known as: LIPITOR Take 20 tablets by mouth daily.   Biotin 1000 MCG tablet Take 1 tablet by mouth daily.   buPROPion 150 MG 24 hr tablet Commonly known as: WELLBUTRIN XL Take 150 mg by mouth daily.   clonazePAM 0.5 MG tablet Commonly known as: KLONOPIN Take 1 tablet by mouth daily as needed.   DULoxetine 60 MG capsule Commonly known as: CYMBALTA Take 1 capsule by mouth daily.   EPINEPHrine 0.3 mg/0.3 mL Soaj injection Commonly known as: EPI-PEN Inject 0.3 mLs (0.3 mg total) into the muscle as needed for anaphylaxis.   famotidine 40 MG tablet Commonly known as: PEPCID Take 1 tablet (40 mg total) by mouth at bedtime.   Flutter Devi Use as directed   glucosamine-chondroitin 500-400 MG tablet Take 1 tablet by mouth daily.   levothyroxine 25 MCG tablet Commonly known as: SYNTHROID Take 1 tablet by mouth daily.   losartan 25 MG tablet Commonly known as: COZAAR Take 1 tablet by mouth daily.   montelukast 10 MG tablet Commonly known as: SINGULAIR TAKE 1 TABLET BY MOUTH EVERYDAY AT BEDTIME   nystatin powder Commonly known as: MYCOSTATIN/NYSTOP APPLY TWICE DAILY AS NEEDED TO AFFECTED AREAS   nystatin-triamcinolone ointment Commonly known as: MYCOLOG APPLY TO AFFECTED AREA TWICE DAILY AS NEEDED   omeprazole 20 MG capsule Commonly known as: PRILOSEC Take 2 capsules (40 mg total)  by mouth every morning.   SINUS RINSE NA Place into the nose daily.   traZODone 100 MG tablet Commonly known as: DESYREL Take 1 tablet by mouth at bedtime.   VITAMIN D3 PO Take 2,000 Units by mouth daily.   VSL#3 DS PO Take by mouth daily.       Past Medical History:  Diagnosis Date  . Asthma   . Bronchiectasis ([place])   . Fatty liver   . Hyperlipidemia   . Hypertension   . OSA (obstructive sleep apnea)   . Ulcerative colitis [facility])     Past Surgical History:  Procedure Laterality Date  . ABDOMINAL HERNIA REPAIR    .  ABDOMINAL PERINEAL BOWEL RESECTION    . CESAREAN SECTION    . TONSILLECTOMY    . UTERINE FIBROID SURGERY      Review of systems negative except as noted in HPI / PMHx or noted below:  Review of Systems  Constitutional: Negative.   HENT: Negative.   Eyes: Negative.   Respiratory: Negative.   Cardiovascular: Negative.   Gastrointestinal: Negative.   Genitourinary: Negative.   Musculoskeletal: Negative.   Skin: Negative.   Neurological: Negative.   Endo/Heme/Allergies: Negative.   Psychiatric/Behavioral: Negative.      Objective:   Vitals:   10/10/19 1349  BP: (!) 142/80  Pulse: 81  Resp: 16  Temp: 97.9 F (36.6 C)  SpO2: 94%          Physical Exam Constitutional:      Appearance: She is not diaphoretic.  HENT:     Head: Normocephalic.     Right Ear: Tympanic membrane, ear canal and external ear normal.     Left Ear: Tympanic membrane, ear canal and external ear normal.     Nose: Nose normal. No mucosal edema (Septal perforation with clear borders) or rhinorrhea.     Mouth/Throat:     Pharynx: Uvula midline. No oropharyngeal exudate.  Eyes:     Conjunctiva/sclera: Conjunctivae normal.  Neck:     Thyroid: No thyromegaly.     Trachea: Trachea normal. No tracheal tenderness or tracheal deviation.  Cardiovascular:     Rate and Rhythm: Normal rate and regular rhythm.     Heart sounds: Normal heart sounds, S1 normal and S2 normal. No murmur.  Pulmonary:     Effort: No respiratory distress.     Breath sounds: Normal breath sounds. No stridor. No wheezing or rales.  Lymphadenopathy:     Head:     Right side of head: No tonsillar adenopathy.     Left side of head: No tonsillar adenopathy.     Cervical: No cervical adenopathy.  Skin:    Findings: No erythema or rash.     Nails: There is no clubbing.  Neurological:     Mental Status: She is alert.     Diagnostics:    Spirometry was performed and demonstrated an FEV1 of 1.20 at 53 % of  predicted.  Assessment and Plan:   1. Not well controlled severe persistent asthma   2. Obstructive bronchiectasis ([place])   3. Chronic pansinusitis   4. Perennial allergic rhinitis   5. LPRD (laryngopharyngeal reflux disease)   6. Other eosinophilia     1.  Allergen avoidance measures - house dust mite, dog  2.  Treat and prevent inflammation:   A.  Symbicort 160-2 inhalations twice a day with spacer  B.  OTC Nasacort- 2 sprays each nostril 1 time per day  C.  Montelukast  10 mg - 1 tablet 1 time per day  D.  Fasenra / benralizumab injections with injection today  3.  Treat and prevent reflux:   A.  Omeprazole 40 mg tablet in a.m.  B.  Famotidine 40 mg tablet in p.m.  4.  If needed:   A.  Albuterol MDI-2 inhalations every 4-6 hours  B.  Nasal saline  C.  OTC antihistamine  5.  Obtain Covid vaccine when available  6.  Return to 12 weeks or earlier if problem  I think that if Tiane can utilize medications on a regular basis she will do much better regarding her eosinophilic driven airway issue and her reflux.  We have once again spent a fair amount of time today going over her medications and giving her a written plan of medications and encouraged her to consistently use all of the anti-inflammatory medications for airway plus following up for injections of benralizumab.  She apparently has a memory issue which may be tied up with untreated sleep apnea which is being evaluated by sleep medicine at [facility].  I will see her back in his clinic in 12 weeks or earlier if there is a problem.  Allena Katz, MD Allergy / Immunology [place]

## 2019-10-10 NOTE — Patient Instructions (Addendum)
  1.  Allergen avoidance measures - house dust mite, dog  2.  Treat and prevent inflammation:   A.  Symbicort 160-2 inhalations twice a day with spacer  B.  OTC Nasacort- 2 sprays each nostril 1 time per day  C.  Montelukast 10 mg - 1 tablet 1 time per day  D.  Fasenra / benralizumab injections with injection today  3.  Treat and prevent reflux:   A.  Omeprazole 40 mg tablet in a.m.  B.  Famotidine 40 mg tablet in p.m.  4.  If needed:   A.  Albuterol MDI-2 inhalations every 4-6 hours  B.  Nasal saline  C.  OTC antihistamine  5.  Obtain Covid vaccine when available  6.  Return to 12 weeks or earlier if problem

## 2019-10-11 ENCOUNTER — Encounter: Payer: Self-pay | Admitting: Allergy and Immunology

## 2019-10-25 ENCOUNTER — Telehealth: Payer: Self-pay | Admitting: Allergy and Immunology

## 2019-10-25 MED ORDER — BUDESONIDE-FORMOTEROL FUMARATE 160-4.5 MCG/ACT IN AERO: INHALATION_SPRAY | RESPIRATORY_TRACT | 5 refills | Status: AC

## 2019-10-25 NOTE — Telephone Encounter (Signed)
Refill sent.

## 2019-10-25 NOTE — Telephone Encounter (Signed)
Patient called and would like Symbicort 160 sent in to the pharmacy. Uses [facility] in [place] on [address].  Please advise.

## 2019-11-07 ENCOUNTER — Ambulatory Visit: Payer: Self-pay

## 2019-11-07 DIAGNOSIS — J4550 Severe persistent asthma, uncomplicated: Secondary | ICD-10-CM | POA: Diagnosis not present

## 2019-11-08 ENCOUNTER — Ambulatory Visit (INDEPENDENT_AMBULATORY_CARE_PROVIDER_SITE_OTHER): Payer: Medicare Other

## 2019-11-08 ENCOUNTER — Other Ambulatory Visit: Payer: Self-pay

## 2019-11-08 ENCOUNTER — Ambulatory Visit (INDEPENDENT_AMBULATORY_CARE_PROVIDER_SITE_OTHER): Payer: Medicare Other | Admitting: Internal Medicine

## 2019-11-08 ENCOUNTER — Encounter: Payer: Self-pay | Admitting: Internal Medicine

## 2019-11-08 DIAGNOSIS — J329 Chronic sinusitis, unspecified: Secondary | ICD-10-CM

## 2019-11-08 DIAGNOSIS — J479 Bronchiectasis, uncomplicated: Secondary | ICD-10-CM

## 2019-11-08 DIAGNOSIS — J4550 Severe persistent asthma, uncomplicated: Secondary | ICD-10-CM | POA: Diagnosis not present

## 2019-11-08 MED ORDER — BENRALIZUMAB 30 MG/ML ~~LOC~~ SOSY
30.0000 mg | PREFILLED_SYRINGE | Freq: Once | SUBCUTANEOUS | Status: AC
  Administered 2019-11-08: 10:00:00 30 mg via SUBCUTANEOUS

## 2019-11-08 NOTE — Progress Notes (Signed)
Subjective:     Patient ID: Adriana White, female   DOB: 1946/06/09,    MRN: [id number]  Brief patient profile:  66 yowf  MM From [place] Quit smoking in 1986 when first and last baby born with baseline of 160 then around  Late  1990s  noted cough and eval by [facility] pulmonologist and eventually dx'd bronchiectasis placed  On advair seemed to help symtoms of sob/ cough initially but worse since around 12/2017 so self referred to pulmonary clinic 03/11/2018      History of Present Illness  03/11/2018 1st [name]/ Adriana White   Chief Complaint  Patient presents with  . Pulmonary Consult    Self referral. She states dxed with bronchiectasis approx 20 yrs ago. She c/o cough with tan sputum and increased SOB over the past 2 months. She states she gets SOB walking 1/2 a block.   pt with bronchiectasis worse cough x 2 months esp hs  With thick brown mucus    s assoc with nasal congestion and overt HB on fosfamax and ppi  but  no cp and never had hemoptysis with flares in past  Sob walking dog esp hills @ moderate pace  rec Stop advair and alendronate for now Augmentin 875 mg take one pill twice daily  X 10 days - take at breakfast and supper with large glass of water.  It would help reduce the usual side effects (diarrhea and yeast infections) if you ate cultured yogurt at lunch.  Symbicort 80 Take 2 puffs first thing in am and then another 2 puffs about 12 hours later.  Work on inhaler technique:  relax and gently blow all the way out then take a nice smooth deep breath back in, triggering the inhaler at same time you start breathing in.  Hold for up to 5 seconds if you can. Blow out thru nose. Rinse and gargle with water when done Change Omeprazole to take  20 mg x 30- 60 min before your first and last meals of the day  GERD diet  For cough/ congestion  > mucinex or mucinex dm up  To 1200 mg every 12 hours as needed Please schedule a follow up office visit in 2 weeks to see one our  NPS, sooner if needed  with all medications /inhalers/ solutions in hand so we can verify exactly what you are taking. This includes all medications from all doctors and over the counters - set up for pfts on return to see me in 6 weeks from now  - also needs f/u cxr and Ig's and alpha one screen      05/04/2018  f/u ov/Adriana White re: bronchiectasis  / did not bring meds / did not schedule pfts  Chief Complaint  Patient presents with  . Acute Visit    Pt c/o "stuffed up" and prod cough with tan sputum 6 days.     breathing is better, able to walk dog easier but cough no better and more brown mucus x one week prior to OV   Overusing saba  rec Symbicort 80 Take 2 puffs first thing in am and then another 2 puffs about 12 hours later.  Work on inhaler technique:  Change Omeprazole to take  20 mg x 30- 60 min before your first and last meals of the day  GERD (REFLUX)  is an extremely common cause of respiratory symptoms just like yours , many times with no obvious heartburn at all.  For cough/ congestion  >  mucinex or mucinex dm up  To 1200 mg every 12 hours as needed         06/15/2018  f/u ov/Adriana White re: bronchiectasis/ sinusitis  Chief Complaint  Patient presents with  . Follow-up    PFT's done today. Breathing has improved slightly- relates to using sinus rinse daily.    Dyspnea:  Walking dog / shopping ok  = MMRC2 = can't walk a nl pace on a flat grade s sob but does fine slow and flat e Cough: esp in am but also thruout the day, seening ent 06/17/18 > beige mucus Sleeping: flat  rec No change in medications and keep appt to see Adriana White - if he feels allergies are an issue I will refer you to Adriana White group        07/03/2019  f/u ov/Adriana White re: obst bronchiectasis, sob and cough  Chief Complaint  Patient presents with  . Acute Visit    F/U breathing, increased SOB. Has several questions about breathing.   Dyspnea:  Room to room mostly limited though by coughing fits  Cough: coughs so  hard can't catch her breath, assoc with nasal congest but no purulent secretions  Sleeping: one bed block ok  SABA use: not sure what inhalers she uses, thoroughly confused with details of care 02: no  rec Try prilosec  Take 30-60 min before first meal of the day and Pepcid ac (famotidine) 20 mg one after supper Plan A = Automatic = Symbicort 80 Take 2 puffs first thing in am and then another 2 puffs about 12 hours later.  STOP  spiriva advair and fish oil for now  Work on inhaler technique:   Plan B = Backup Only use your albuterol inhaler as a rescue medication Please schedule a follow up office visit in 2 weeks, call sooner if needed with all medications /inhalers/ solutions in hand so we can verify exactly what you are taking. This includes all medications from all doctors and over the [place] separate them into two bags:  the ones you take automatically, no matter what, vs the ones you take just when you feel you need them "BAG #2 is UP TO YOU"  - this will really help [place] help you take your medications more effectively.  >>> has one symb sample    08/08/2019  f/u ov/Adriana White re: obst bronchiectasis on symb 80 2bid poor adherence (did not use sample up yet and having trouble affording rx)  Chief Complaint  Patient presents with  . Follow-up    Breathing is about the same. She rarely uses her albuterol.  Dyspnea:  Walking dog around neighborhood some hills  Cough: better but still aggravated at hs for just a few min then sleeps fine Sleeping: able to sleep on bed blocks  SABA use: rarely  02: no  rec Try prilosec  Take 30-60 min before first meal of the day and Pepcid ac (famotidine) 20 mg one after supper Plan A = Automatic = Symbicort 80 Take 2 puffs first thing in am and then another 2 puffs about 12 hours later.  STOP  spiriva advair and fish oil for now  Work on inhaler technique:     Plan B = Backup Only use your albuterol inhaler     11/08/2019  f/u ov/Adriana White re:  obst  bronchiectasis, now f/b Adriana White on allergy shots Chief Complaint  Patient presents with  . Follow-up    Breathing is doing well and no new  co's. She states has not had to use her rescue inhaler. She has cough with tan colored sputum.   Dyspnea:  Still walking dog, some hills Cough: worse at hs  Sleeping: sev times a night /hob up on bed blocks  SABA use: no rescue nor maintenance 02: none   No obvious day to day or daytime variability or assoc excess/ purulent sputum or mucus plugs or hemoptysis or cp or chest tightness, subjective wheeze or overt sinus or hb symptoms.    Also denies any obvious fluctuation of symptoms with weather or environmental changes or other aggravating or alleviating factors except as outlined above   No unusual exposure hx or h/o childhood pna/ asthma or knowledge of premature birth.  Current Allergies, Complete Past Medical History, Past Surgical History, Family History, and Social History were reviewed in [employer] record.  ROS  The following are not active complaints unless bolded Hoarseness, sore throat, dysphagia, dental problems, itching, sneezing,  nasal congestion or discharge of excess mucus or purulent secretions, ear ache,   fever, chills, sweats, unintended wt loss or wt gain, classically pleuritic or exertional cp,  orthopnea pnd or arm/hand swelling  or leg swelling, presyncope, palpitations, abdominal pain, anorexia, nausea, vomiting, diarrhea  or change in bowel habits or change in bladder habits, change in stools or change in urine, dysuria, hematuria,  rash, arthralgias, visual complaints, headache, numbness, weakness or ataxia or problems with walking or coordination,  change in mood or  memory.        Current Meds  Medication Sig  . albuterol (PROVENTIL HFA;VENTOLIN HFA) 108 (90 Base) MCG/ACT inhaler Inhale two puffs every four to six hours as needed for cough or wheeze.  Marland Kitchen aspirin 81 MG chewable tablet Chew 1 tablet  by mouth daily.  Marland Kitchen atorvastatin (LIPITOR) 40 MG tablet Take 20 tablets by mouth daily.   . Biotin 1000 MCG tablet Take 1 tablet by mouth daily.  . budesonide-formoterol (SYMBICORT) 160-4.5 MCG/ACT inhaler Inhale 2 puffs into the lungs twice daily with spacer. Rinse, gargle and spit after use.  Marland Kitchen buPROPion (WELLBUTRIN XL) 150 MG 24 hr tablet Take 150 mg by mouth daily.  . Cholecalciferol (VITAMIN D3 PO) Take 2,000 Units by mouth daily.  . clonazePAM (KLONOPIN) 0.5 MG tablet Take 1 tablet by mouth daily as needed.  . DULoxetine (CYMBALTA) 60 MG capsule Take 1 capsule by mouth daily.  Marland Kitchen EPINEPHrine 0.3 mg/0.3 mL IJ SOAJ injection Inject 0.3 mLs (0.3 mg total) into the muscle as needed for anaphylaxis.  . famotidine (PEPCID) 40 MG tablet Take 1 tablet (40 mg total) by mouth at bedtime.  Marland Kitchen glucosamine-chondroitin 500-400 MG tablet Take 1 tablet by mouth daily.  . Hypertonic Nasal Wash (SINUS RINSE NA) Place into the nose daily.  Marland Kitchen levothyroxine (SYNTHROID, LEVOTHROID) 25 MCG tablet Take 1 tablet by mouth daily.  Marland Kitchen losartan (COZAAR) 25 MG tablet Take 1 tablet by mouth daily.  . montelukast (SINGULAIR) 10 MG tablet TAKE 1 TABLET BY MOUTH EVERYDAY AT BEDTIME  . nystatin (MYCOSTATIN/NYSTOP) powder APPLY TWICE DAILY AS NEEDED TO AFFECTED AREAS  . nystatin-triamcinolone ointment (MYCOLOG) APPLY TO AFFECTED AREA TWICE DAILY AS NEEDED  . omeprazole (PRILOSEC) 20 MG capsule Take 2 capsules (40 mg total) by mouth every morning.  Marland Kitchen Respiratory Therapy Supplies (FLUTTER) DEVI Use as directed  . traZODone (DESYREL) 100 MG tablet Take 1 tablet by mouth at bedtime.  Objective:   Physical Exam    amb wf nad   11/08/2019         189  08/08/2019      191  07/03/2019        188  06/15/2018        180   05/04/18 183 lb 9.6 oz (83.3 kg)  03/31/18 189 lb 12.8 oz (86.1 kg)  03/11/18 193 lb (87.5 kg)     Vital signs reviewed  11/08/2019  - Note at rest 02 sats  96% on RA     HEENT :  pt wearing mask not removed for exam due to covid - 19 concerns.   NECK :  without JVD/Nodes/TM/ nl carotid upstrokes bilaterally   LUNGS: no acc muscle use,  Min barrel  contour chest wall with bilateral  slightly decreased bs/ min exp rhonchi bilaterally  and  without cough on insp or exp maneuvers and min  Hyperresonant  to  percussion bilaterally     CV:  RRR  no s3 or murmur or increase in P2, and no edema   ABD:  Obese soft and nontender with pos end  insp Hoover's  in the supine position. No bruits or organomegaly appreciated, bowel sounds nl  MS:   Nl gait/  ext warm without deformities, calf tenderness, cyanosis or clubbing No obvious joint restrictions   SKIN: warm and dry without lesions    NEURO:  alert, approp, nl sensorium with  no motor or cerebellar deficits apparent.                   Assessment:

## 2019-11-08 NOTE — Patient Instructions (Signed)
No change in medications    If not doing better with Dr [name] treatment plan, may need to consider sinus surgery as an option    If you are satisfied with your treatment plan,  let your doctor know and he/she can either refill your medications or you can return here when your prescription runs out.     If in any way you are not 100% satisfied,  please tell us.  If 100% better, tell your friends!  Pulmonary follow up is as needed

## 2019-11-09 ENCOUNTER — Encounter: Payer: Self-pay | Admitting: Internal Medicine

## 2019-11-09 NOTE — Assessment & Plan Note (Signed)
Onset of symptoms late 25s dx by [facility] 03/11/2018   rec try symbicort 80 2bid and stop fosfamax - Quant Ig's  05/19/18 nl  - Alpha One AT screen 05/19/18  MM/ level 144  - Allergy profile 05/04/2018 >  Eos 0.5/  IgE 159   RAST  Pos ragweed/grass/ dust/trees/dog/ dust  - 05/04/2018     > continue symb 80 2bid  -HRCT chest 05/2018 >scattered bronchiectasis , scarring  - PFT's  06/15/2018  FEV1 1.46 (84 % ) ratio 75  p 0 % improvement from saba p symb 80 x 2  prior to study with DLCO  89/89 % corrects to 100  % for alv volume   -  Kozlow eval  09/12/18 rec consider biologics  - 07/03/2019  After extensive coaching inhaler device,  effectiveness =    75% > resume symbicort and d/c all others x for alb prn  - 08/08/2019  After extensive coaching inhaler device,  effectiveness =    75% (short Ti) > try off to be sure it's helping and just use prn    Improving with rx by Dr [name] and better hfa > f/u here can be prn   Pt informed of the seriousness of COVID 19 infection as a direct risk to lung health  and safey and to close contacts and should continue to wear a facemask in public and minimize exposure to public locations but especially avoid any area or activity where non-close contacts are not observing distancing or wearing an appropriate face mask.  I strongly recommended vaccine when offered.

## 2019-11-09 NOTE — Assessment & Plan Note (Signed)
CT sinus 05/27/18  1. 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 2. Moderate mucosal thickening of the maxillary sinuses and the sphenoid sinuses. 3. Patent frontal sinus drainage pathways, sphenoid ethmoid recesses, and left ostiomeatal unit. 4. Opacified right ostiomeatal unit. 5. Moderate rightward nasal septal deviation>>  Referred to ENT > [name] 06/17/18 > rec surgery ? Ever done  -  Kozlow eval  09/12/18 rec consider biologics   If not doing better with max rx for allergy then might reconsider surgery option but she should discuss this with Dr [name] to be sure med rx is max first/ advised          Each maintenance medication was reviewed in detail including emphasizing most importantly the difference between maintenance and prns and under what circumstances the prns are to be triggered using an action plan format where appropriate.  Total time for H and P, chart review, counseling, teaching review and generating customized AVS unique to this office visit / charting = 20 min

## 2019-12-15 ENCOUNTER — Telehealth: Payer: Self-pay | Admitting: Internal Medicine

## 2019-12-15 NOTE — Telephone Encounter (Signed)
Message routed to app of the day, Rexene Edison, NP  Tammy, this has been sent as an FYI:  Unable to reach the patient.  Had to leave a message on her voicemail to let her know that if her Symbicort and rescue inhaler are working to improve her breathing and her shortness of breath worsens, she needs to go to either the nearest emergency room or urgent care .  The patient was already scheduled for an office visit with Dr. Melvyn Novas on 12/18/19 before call from triage was placed.

## 2019-12-18 ENCOUNTER — Emergency Department (HOSPITAL_BASED_OUTPATIENT_CLINIC_OR_DEPARTMENT_OTHER): Payer: Medicare Other

## 2019-12-18 ENCOUNTER — Ambulatory Visit (INDEPENDENT_AMBULATORY_CARE_PROVIDER_SITE_OTHER): Payer: Medicare Other | Admitting: Internal Medicine

## 2019-12-18 ENCOUNTER — Other Ambulatory Visit: Payer: Self-pay

## 2019-12-18 ENCOUNTER — Encounter: Payer: Self-pay | Admitting: Internal Medicine

## 2019-12-18 ENCOUNTER — Telehealth: Payer: Self-pay | Admitting: Internal Medicine

## 2019-12-18 ENCOUNTER — Emergency Department (HOSPITAL_BASED_OUTPATIENT_CLINIC_OR_DEPARTMENT_OTHER)
Admission: EM | Admit: 2019-12-18 | Discharge: 2019-12-19 | Disposition: A | Discharge status: Home / Self Care | Payer: Medicare Other | Attending: Emergency Medicine | Admitting: Emergency Medicine

## 2019-12-18 ENCOUNTER — Ambulatory Visit (INDEPENDENT_AMBULATORY_CARE_PROVIDER_SITE_OTHER): Payer: Medicare Other

## 2019-12-18 ENCOUNTER — Encounter (HOSPITAL_BASED_OUTPATIENT_CLINIC_OR_DEPARTMENT_OTHER): Payer: Self-pay | Admitting: *Deleted

## 2019-12-18 DIAGNOSIS — Z882 Allergy status to sulfonamides status: Secondary | ICD-10-CM | POA: Diagnosis not present

## 2019-12-18 DIAGNOSIS — Z87891 Personal history of nicotine dependence: Secondary | ICD-10-CM | POA: Insufficient documentation

## 2019-12-18 DIAGNOSIS — R918 Other nonspecific abnormal finding of lung field: Secondary | ICD-10-CM | POA: Insufficient documentation

## 2019-12-18 DIAGNOSIS — R0600 Dyspnea, unspecified: Secondary | ICD-10-CM

## 2019-12-18 DIAGNOSIS — E785 Hyperlipidemia, unspecified: Secondary | ICD-10-CM | POA: Insufficient documentation

## 2019-12-18 DIAGNOSIS — J181 Lobar pneumonia, unspecified organism: Secondary | ICD-10-CM

## 2019-12-18 DIAGNOSIS — R0609 Other forms of dyspnea: Secondary | ICD-10-CM

## 2019-12-18 DIAGNOSIS — J479 Bronchiectasis, uncomplicated: Secondary | ICD-10-CM | POA: Diagnosis not present

## 2019-12-18 DIAGNOSIS — Z888 Allergy status to other drugs, medicaments and biological substances status: Secondary | ICD-10-CM | POA: Insufficient documentation

## 2019-12-18 DIAGNOSIS — R0602 Shortness of breath: Secondary | ICD-10-CM | POA: Diagnosis present

## 2019-12-18 DIAGNOSIS — Z79899 Other long term (current) drug therapy: Secondary | ICD-10-CM | POA: Insufficient documentation

## 2019-12-18 DIAGNOSIS — I10 Essential (primary) hypertension: Secondary | ICD-10-CM | POA: Diagnosis not present

## 2019-12-18 DIAGNOSIS — J45909 Unspecified asthma, uncomplicated: Secondary | ICD-10-CM | POA: Diagnosis not present

## 2019-12-18 DIAGNOSIS — R7989 Other specified abnormal findings of blood chemistry: Secondary | ICD-10-CM

## 2019-12-18 LAB — CBC WITH DIFFERENTIAL/PLATELET
Basophils Absolute: 0 10*3/uL (ref 0.0–0.1)
Basophils Relative: 0.2 % (ref 0.0–3.0)
Eosinophils Absolute: 0 10*3/uL (ref 0.0–0.7)
Eosinophils Relative: 0 % (ref 0.0–5.0)
HCT: 39.5 % (ref 36.0–46.0)
Hemoglobin: 13.4 g/dL (ref 12.0–15.0)
Lymphocytes Relative: 27.6 % (ref 12.0–46.0)
Lymphs Abs: 1.9 10*3/uL (ref 0.7–4.0)
MCHC: 34 g/dL (ref 30.0–36.0)
MCV: 90 fl (ref 78.0–100.0)
Monocytes Absolute: 0.4 10*3/uL (ref 0.1–1.0)
Monocytes Relative: 6.2 % (ref 3.0–12.0)
Neutro Abs: 4.6 10*3/uL (ref 1.4–7.7)
Neutrophils Relative %: 66 % (ref 43.0–77.0)
Platelets: 326 10*3/uL (ref 150.0–400.0)
RBC: 4.39 Mil/uL (ref 3.87–5.11)
RDW: 13.7 % (ref 11.5–15.5)
WBC: 6.9 10*3/uL (ref 4.0–10.5)

## 2019-12-18 LAB — BASIC METABOLIC PANEL
Anion gap: 10 (ref 5–15)
BUN: 12 mg/dL (ref 6–23)
BUN: 12 mg/dL (ref 8–23)
CO2: 26 mEq/L (ref 19–32)
CO2: 24 mmol/L (ref 22–32)
Calcium: 9 mg/dL (ref 8.4–10.5)
Calcium: 9.1 mg/dL (ref 8.9–10.3)
Chloride: 105 mEq/L (ref 96–112)
Chloride: 104 mmol/L (ref 98–111)
Creatinine, Ser: 0.72 mg/dL (ref 0.40–1.20)
Creatinine, Ser: 0.76 mg/dL (ref 0.44–1.00)
GFR calc Af Amer: >60 mL/min (ref >60)
GFR calc non Af Amer: >60 mL/min (ref >60)
GFR: 79.62 mL/min (ref >60.00)
Glucose, Bld: 119 mg/dL — ABNORMAL HIGH (ref 70–99)
Glucose, Bld: 93 mg/dL (ref 70–99)
Potassium: 3.7 mEq/L (ref 3.5–5.1)
Potassium: 3.5 mmol/L (ref 3.5–5.1)
Sodium: 140 mEq/L (ref 135–145)
Sodium: 138 mmol/L (ref 135–145)

## 2019-12-18 LAB — TSH: TSH: 1.67 u[IU]/mL (ref 0.35–4.50)

## 2019-12-18 LAB — BRAIN NATRIURETIC PEPTIDE
B Natriuretic Peptide: 36.3 pg/mL (ref 0.0–100.0)
Pro B Natriuretic peptide (BNP): 24 pg/mL (ref 0.0–100.0)

## 2019-12-18 LAB — D-DIMER, QUANTITATIVE: D-Dimer, Quant: 1.6 mcg/mL FEU — ABNORMAL HIGH (ref <0.50)

## 2019-12-18 MED ORDER — AMOXICILLIN-POT CLAVULANATE 875-125 MG PO TABS
1.0000 | ORAL_TABLET | Freq: Once | ORAL | Status: AC
  Administered 2019-12-18: 1 via ORAL
  Filled 2019-12-18: qty 1

## 2019-12-18 MED ORDER — ALBUTEROL SULFATE HFA 108 (90 BASE) MCG/ACT IN AERS: INHALATION_SPRAY | RESPIRATORY_TRACT | 2 refills | Status: AC

## 2019-12-18 MED ORDER — AMOXICILLIN-POT CLAVULANATE 875-125 MG PO TABS: 1.0000 | ORAL_TABLET | Freq: Two times a day (BID) | ORAL | 0 refills | Status: DC

## 2019-12-18 MED ORDER — IOHEXOL 350 MG/ML SOLN
75.0000 mL | Freq: Once | INTRAVENOUS | Status: AC | PRN
  Administered 2019-12-18: 75 mL via INTRAVENOUS

## 2019-12-18 NOTE — Assessment & Plan Note (Signed)
Onset of symptoms late 83s dx by [facility] 03/11/2018   rec try symbicort 80 2bid and stop fosfamax - Quant Ig's  05/19/18 nl  - Alpha One AT screen 05/19/18  MM/ level 144  - Allergy profile 05/04/2018 >  Eos 0.5/  IgE 159   RAST  Pos ragweed/grass/ dust/trees/dog/ dust  - 05/04/2018     > continue symb 80 2bid  -HRCT chest 05/2018 >scattered bronchiectasis , scarring  - PFT's  06/15/2018  FEV1 1.46 (84 % ) ratio 75  p 0 % improvement from saba p symb 80 x 2  prior to study with DLCO  89/89 % corrects to 100  % for alv volume   -  Kozlow eval  09/12/18 rec consider biologics  - 07/03/2019  After extensive coaching inhaler device,  effectiveness =    75% > resume symbicort and d/c all others x for alb prn   - 08/08/2019  After extensive coaching inhaler device,  effectiveness =    75% (short Ti) > try off > worse as of 12/18/2019 rec restart symb 160 2bid

## 2019-12-18 NOTE — Telephone Encounter (Signed)
Call patient : Studies are suggestive of a blood clot though this is not diagnostic and I doubt it's acute if present at all so rec CTa asap (by tomorrow pm should be ok) and if neg will also need bilateral venous dopplers to complete the w/u )  If condition worsens while awaiting study can go to ER and have it done there.   I called and spoke with the pt and notified of results and she verbalized understanding  CTA was ordered

## 2019-12-18 NOTE — ED Triage Notes (Addendum)
Pt reports SOB with nonproductive cough x 1 week. Denies fever, HA, CP.

## 2019-12-18 NOTE — Patient Instructions (Addendum)
Restart symbiocort 160  Take 2 puffs first thing in am and then another 2 puffs about 12 hours later.   Only use your albuterol as a rescue medication to be used if you can't catch your breath by resting or doing a relaxed purse lip breathing pattern.  - The less you use it, the better it will work when you need it. - Ok to use up to 2 puffs  every 4 hours if you must but call for immediate appointment if use goes up over your usual need - Don't leave home without it !!  (think of it like the spare tire for your car)    Please remember to go to the lab and x-ray department   for your tests - we will call you with the results when they are available.    Please schedule a follow up office visit in 4 weeks, sooner if needed  with all medications /inhalers/ solutions in hand so we can verify exactly what you are taking. This includes all medications from all doctors and over the counters

## 2019-12-18 NOTE — Progress Notes (Signed)
Subjective:     Patient ID: Adriana White, female   DOB: 09-Aug-1946,    MRN: [id number]  Brief patient profile:  37 yowf  MM From [place] Quit smoking in 1986 when first and last baby born with baseline of 160 then around  Late  1990s  noted cough and eval by [facility] pulmonologist and eventually dx'd bronchiectasis placed  On advair seemed to help symtoms of sob/ cough initially but worse since around 12/2017 so self referred to pulmonary clinic 03/11/2018      History of Present Illness  03/11/2018 1st [facility] office visit/ Adriana White   Chief Complaint  Patient presents with  . Pulmonary Consult    Self referral. She states dxed with bronchiectasis approx 20 yrs ago. She c/o cough with tan sputum and increased SOB over the past 2 months. She states she gets SOB walking 1/2 a block.   pt with bronchiectasis worse cough x 2 months esp hs  With thick brown mucus    s assoc with nasal congestion and overt HB on fosfamax and ppi  but  no cp and never had hemoptysis with flares in past  Sob walking dog esp hills @ moderate pace  rec Stop advair and alendronate for now Augmentin 875 mg take one pill twice daily  X 10 days - take at breakfast and supper with large glass of water.  It would help reduce the usual side effects (diarrhea and yeast infections) if you ate cultured yogurt at lunch.  Symbicort 80 Take 2 puffs first thing in am and then another 2 puffs about 12 hours later.  Work on inhaler technique:  relax and gently blow all the way out then take a nice smooth deep breath back in, triggering the inhaler at same time you start breathing in.  Hold for up to 5 seconds if you can. Blow out thru nose. Rinse and gargle with water when done Change Omeprazole to take  20 mg x 30- 60 min before your first and last meals of the day  GERD diet  For cough/ congestion  > mucinex or mucinex dm up  To 1200 mg every 12 hours as needed Please schedule a follow up office visit in 2 weeks to see one our  NPS, sooner if needed  with all medications /inhalers/ solutions in hand so we can verify exactly what you are taking. This includes all medications from all doctors and over the counters - set up for pfts on return to see me in 6 weeks from now  - also needs f/u cxr and Ig's and alpha one screen      05/04/2018  f/u ov/Adriana White re: bronchiectasis  / did not bring meds / did not schedule pfts  Chief Complaint  Patient presents with  . Acute Visit    Pt c/o "stuffed up" and prod cough with tan sputum 6 days.     breathing is better, able to walk dog easier but cough no better and more brown mucus x one week prior to OV   Overusing saba  rec Symbicort 80 Take 2 puffs first thing in am and then another 2 puffs about 12 hours later.  Work on inhaler technique:  Change Omeprazole to take  20 mg x 30- 60 min before your first and last meals of the day  GERD (REFLUX)  is an extremely common cause of respiratory symptoms just like yours , many times with no obvious heartburn at all.  For cough/ congestion  >  mucinex or mucinex dm up  To 1200 mg every 12 hours as needed         06/15/2018  f/u ov/Adriana White re: bronchiectasis/ sinusitis  Chief Complaint  Patient presents with  . Follow-up    PFT's done today. Breathing has improved slightly- relates to using sinus rinse daily.    Dyspnea:  Walking dog / shopping ok  = MMRC2 = can't walk a nl pace on a flat grade s sob but does fine slow and flat e Cough: esp in am but also thruout the day, seening ent 06/17/18 > beige mucus Sleeping: flat  rec No change in medications and keep appt to see Adriana Adriana White - if he feels allergies are an issue I will refer you to Adriana Adriana White group        07/03/2019  f/u ov/Adriana White re: obst bronchiectasis, sob and cough  Chief Complaint  Patient presents with  . Acute Visit    F/U breathing, increased SOB. Has several questions about breathing.   Dyspnea:  Room to room mostly limited though by coughing fits  Cough: coughs so  hard can't catch her breath, assoc with nasal congest but no purulent secretions  Sleeping: one bed block ok  SABA use: not sure what inhalers she uses, thoroughly confused with details of care 02: no  rec Try prilosec  Take 30-60 min before first meal of the day and Pepcid ac (famotidine) 20 mg one after supper Plan A = Automatic = Symbicort 80 Take 2 puffs first thing in am and then another 2 puffs about 12 hours later.  STOP  spiriva advair and fish oil for now  Work on inhaler technique:   Plan B = Backup Only use your albuterol inhaler as a rescue medication Please schedule a follow up office visit in 2 weeks, call sooner if needed with all medications /inhalers/ solutions in hand so we can verify exactly what you are taking. This includes all medications from all doctors and over the [place] separate them into two bags:  the ones you take automatically, no matter what, vs the ones you take just when you feel you need them "BAG #2 is UP TO YOU"  - this will really help [place] help you take your medications more effectively.  >>> has one symb sample    08/08/2019  f/u ov/Adriana White re: obst bronchiectasis on symb 80 2bid poor adherence (did not use sample up yet and having trouble affording rx)  Chief Complaint  Patient presents with  . Follow-up    Breathing is about the same. She rarely uses her albuterol.  Dyspnea:  Walking dog around neighborhood some hills  Cough: better but still aggravated at hs for just a few min then sleeps fine Sleeping: able to sleep on bed blocks  SABA use: rarely  02: no  rec Try prilosec  Take 30-60 min before first meal of the day and Pepcid ac (famotidine) 20 mg one after supper Plan A = Automatic = Symbicort 80 Take 2 puffs first thing in am and then another 2 puffs about 12 hours later.  STOP  spiriva advair and fish oil for now  Work on inhaler technique:     Plan B = Backup Only use your albuterol inhaler     11/08/2019  f/u ov/Adriana White re:  obst  bronchiectasis, now f/b Adriana White on allergy shots Chief Complaint  Patient presents with  . Follow-up    Breathing is doing well and no new  co's. She states has not had to use her rescue inhaler. She has cough with tan colored sputum.   Dyspnea:  Still walking dog, some hills Cough: worse at hs  Sleeping: sev times a night /hob up on bed blocks  SABA use: no rescue nor maintenance 02: none rec F/u Kozlow ok continue off symbicort trial basis   12/18/2019  f/u ov/Jarah Pember re: obst bronchiectasis worse doe off symbicort gradually but esp last 6 days  Chief Complaint  Patient presents with  . Acute Visit    Increased SOB x 6 days. She states she has never used her albuterol.   Dyspnea:  X  75 ft flat surface/ no longer walking dog (was doing x 15 min and stopping half way)  Cough: no change in cough  Sleeping: bed blocks , no resp symptoms  SABA use: not using  02: none    No obvious day to day or daytime variability or assoc excess/ purulent sputum or mucus plugs or hemoptysis or cp or chest tightness, subjective wheeze or overt sinus or hb symptoms.   Sleeping as above  without nocturnal  or early am exacerbation  of respiratory  c/o's or need for noct saba. Also denies any obvious fluctuation of symptoms with weather or environmental changes or other aggravating or alleviating factors except as outlined above   No unusual exposure hx or h/o childhood pna/ asthma or knowledge of premature birth.  Current Allergies, Complete Past Medical History, Past Surgical History, Family History, and Social History were reviewed in [employer] record.  ROS  The following are not active complaints unless bolded Hoarseness, sore throat, dysphagia, dental problems, itching, sneezing,  nasal congestion or discharge of excess mucus or purulent secretions, ear ache,   fever, chills, sweats, unintended wt loss or wt gain, classically pleuritic or exertional cp,  orthopnea pnd or  arm/hand swelling  or leg swelling, presyncope, palpitations, abdominal pain, anorexia, nausea, vomiting, diarrhea  or change in bowel habits or change in bladder habits, change in stools or change in urine, dysuria, hematuria,  rash, arthralgias, visual complaints, headache, numbness, weakness or ataxia or problems with walking or coordination,  change in mood or  memory.        Current Meds  Medication Sig  . albuterol (PROVENTIL HFA;VENTOLIN HFA) 108 (90 Base) MCG/ACT inhaler Inhale two puffs every four to six hours as needed for cough or wheeze.  Marland Kitchen aspirin 81 MG chewable tablet Chew 1 tablet by mouth daily.  Marland Kitchen atorvastatin (LIPITOR) 40 MG tablet Take 20 tablets by mouth daily.   . Biotin 1000 MCG tablet Take 1 tablet by mouth daily.  . budesonide-formoterol (SYMBICORT) 160-4.5 MCG/ACT inhaler Inhale 2 puffs into the lungs twice daily with spacer. Rinse, gargle and spit after use.  Marland Kitchen buPROPion (WELLBUTRIN XL) 150 MG 24 hr tablet Take 150 mg by mouth daily.  . Cholecalciferol (VITAMIN D3 PO) Take 2,000 Units by mouth daily.  . clonazePAM (KLONOPIN) 0.5 MG tablet Take 1 tablet by mouth daily as needed.  . DULoxetine (CYMBALTA) 60 MG capsule Take 1 capsule by mouth daily.  Marland Kitchen EPINEPHrine 0.3 mg/0.3 mL IJ SOAJ injection Inject 0.3 mLs (0.3 mg total) into the muscle as needed for anaphylaxis.  . famotidine (PEPCID) 40 MG tablet Take 1 tablet (40 mg total) by mouth at bedtime.  Marland Kitchen glucosamine-chondroitin 500-400 MG tablet Take 1 tablet by mouth daily.  . Hypertonic Nasal Wash (SINUS RINSE NA) Place into the nose daily.  Marland Kitchen  levothyroxine (SYNTHROID, LEVOTHROID) 25 MCG tablet Take 1 tablet by mouth daily.  Marland Kitchen losartan (COZAAR) 25 MG tablet Take 1 tablet by mouth daily.  . montelukast (SINGULAIR) 10 MG tablet TAKE 1 TABLET BY MOUTH EVERYDAY AT BEDTIME  . nystatin (MYCOSTATIN/NYSTOP) powder APPLY TWICE DAILY AS NEEDED TO AFFECTED AREAS  . nystatin-triamcinolone ointment (MYCOLOG) APPLY TO AFFECTED AREA  TWICE DAILY AS NEEDED  . omeprazole (PRILOSEC) 20 MG capsule Take 2 capsules (40 mg total) by mouth every morning.  Marland Kitchen Respiratory Therapy Supplies (FLUTTER) DEVI Use as directed  . traZODone (DESYREL) 100 MG tablet Take 1 tablet by mouth at bedtime.                Objective:   Physical Exam    amb wf nad   12/18/2019           192  11/08/2019         189  08/08/2019      191  07/03/2019        188  06/15/2018        180   05/04/18 183 lb 9.6 oz (83.3 kg)  03/31/18 189 lb 12.8 oz (86.1 kg)  03/11/18 193 lb (87.5 kg)     Vital signs reviewed  12/18/2019  - Note at rest 02 sats  95% on RA      HEENT : pt wearing mask not removed for exam due to covid - 19 concerns.   NECK :  without JVD/Nodes/TM/ nl carotid upstrokes bilaterally   LUNGS: no acc muscle use,  Min barrel  contour chest wall with bilateral  slightly decreased bs s audible wheeze and  without cough on insp or exp maneuvers and min  Hyperresonant  to  percussion bilaterally     CV:  RRR  no s3 or murmur or increase in P2, and no edema   ABD:  soft and nontender with pos end  insp Hoover's  in the supine position. No bruits or organomegaly appreciated, bowel sounds nl  MS:   Nl gait/  ext warm without deformities, calf tenderness, cyanosis or clubbing No obvious joint restrictions   SKIN: warm and dry without lesions    NEURO:  alert, approp, nl sensorium with  no motor or cerebellar deficits apparent.       CXR PA and Lateral:   12/18/2019 :    I personally reviewed images and agree with radiology impression as follows:    Unchanged subtle heterogeneous opacity of the lung bases, in keeping with scarring or atelectasis seen on prior CT. No new or acute appearing airspace opacity.  Labs ordered/ reviewed:      Chemistry      Component Value Date/Time   NA 140 12/18/2019 1200   K 3.7 12/18/2019 1200   CL 105 12/18/2019 1200   CO2 26 12/18/2019 1200   BUN 12 12/18/2019 1200   CREATININE 0.72 12/18/2019  1200      Component Value Date/Time   CALCIUM 9.0 12/18/2019 1200        Lab Results  Component Value Date   WBC 6.9 12/18/2019   HGB 13.4 12/18/2019   HCT 39.5 12/18/2019   MCV 90.0 12/18/2019   PLT 326.0 12/18/2019       EOS  0.0                                     12/18/2019   Lab Results  Component Value Date   DDIMER 1.60 (H) 12/18/2019      Lab Results  Component Value Date   TSH 1.67 12/18/2019     Lab Results  Component Value Date   PROBNP 24.0 12/18/2019                    Assessment:

## 2019-12-18 NOTE — Assessment & Plan Note (Addendum)
Acute change in baseline late Feb 2021   Symptoms are markedly disproportionate to objective findings and not clear to what extent this is actually a pulmonary  problem but pt does appear to have difficult to sort out respiratory symptoms of unknown origin for which  DDX  = almost all start with A and  include Adherence, Ace Inhibitors, Acid Reflux, Active Sinus Disease, Alpha 1 Antitripsin deficiency, Anxiety masquerading as Airways dz,  ABPA,  Allergy(esp in young), Aspiration (esp in elderly), Adverse effects of meds,  Active smoking or Vaping, A bunch of PE's/clot burden (a few small clots can't cause this syndrome unless there is already severe underlying pulm or vascular dz with poor reserve),  Anemia or thyroid disorder, plus two Bs  = Bronchiectasis and Beta blocker use..and one C= CHF     Adherence is always the initial "prime suspect" and is a multilayered concern that requires a "trust but verify" approach in every patient - starting with knowing how to use medications, especially inhalers, correctly, keeping up with refills and understanding the fundamental difference between maintenance and prns vs those medications only taken for a very short course and then stopped and not refilled.  - reviewed approp use of maint/ prn inhalers - return with all meds in hand using a trust but verify approach to confirm accurate Medication  Reconciliation The principal here is that until we are certain that the  patients are doing what we've asked, it makes no sense to ask them to do more.    ? Acid (or non-acid) GERD > always difficult to exclude as up to 75% of pts in some series report no assoc GI/ Heartburn symptoms> rec continue max (24h)  acid suppression and diet restrictions/ reviewed     ? A bunch of PEs > d dimer over 1.00 in this setting is not helpful ruling out PE so need to proceed with CTa > asap > to ER if needed  ? Allergies /asthma > no flare of cough or noct flares but has not tried  saba or restarting symbicort which I suggested should have been first steps  ? Anemia/ thyroid dz > excluded today  ? Anxiety/depression/ deconditioning  > usually at the bottom of this list of usual suspects but should be included here  and note already on psychotropics and may interfere with adherence and also interpretation of response or lack thereof to symptom management which can be quite subjective.   ? Adverse drug effects > none of the usual suspects listed  ? Bronchiectasis > no evidence of flare   ? chf > excluded with bnp so low     >>> F/u with venous dopplers if CTa neg and if both neg f/u in 4 weeks         Each maintenance medication was reviewed in detail including emphasizing most importantly the difference between maintenance and prns and under what circumstances the prns are to be triggered using an action plan format where appropriate.  Total time for H and P, chart review, counseling,  and generating customized AVS unique to this Acute office visit / charting = 40 min

## 2019-12-18 NOTE — ED Provider Notes (Signed)
[facility] EMERGENCY DEPARTMENT Provider Note   CSN: [id number] Arrival date & time: 12/18/19  1817     History Chief Complaint  Patient presents with  . Shortness of Breath    Adriana White is a 74 y.o. female.  74 year old female with past medical history including bronchiectasis, asthma, OSA, ulcerative colitis, hypertension, hyperlipidemia who presents with shortness of breath.  Patient follows with pulmonology for her lung problems.  She has had increased shortness of breath over the past 1 week and her pulmonologist evaluated her today in the clinic.  D-dimer was sent which was elevated and she presents for CT scan to evaluate for PE.  She has a chronic cough, states that it is not significantly different from usual.  She has not had any associated fever, chest pain, leg swelling, or PND. She does endorse orthopnea. No weight gain.  The history is provided by the patient and medical records.  Shortness of Breath      Past Medical History:  Diagnosis Date  . Asthma   . Bronchiectasis ([place])   . Fatty liver   . Hyperlipidemia   . Hypertension   . OSA (obstructive sleep apnea)   . Ulcerative colitis [facility])     Patient Active Problem List   Diagnosis Date Noted  . DOE (dyspnea on exertion) 12/18/2019  . Chronic sinusitis with polyps 05/28/2018  . Cough 05/05/2018  . Thrush 03/31/2018  . Obstructive bronchiectasis ([place]) 03/11/2018    Past Surgical History:  Procedure Laterality Date  . ABDOMINAL HERNIA REPAIR    . ABDOMINAL PERINEAL BOWEL RESECTION    . CESAREAN SECTION    . TONSILLECTOMY    . UTERINE FIBROID SURGERY       OB History   No obstetric history on file.     Family History  Problem Relation Age of Onset  . COPD Mother   . Heart attack Mother   . Hodgkin's lymphoma Father   . Alcoholism Brother   . Cancer Brother     Social History   Tobacco Use  . Smoking status: Former Smoker    Packs/day: 1.50    Years: 17.00    Pack  years: 25.50    Types: Cigarettes    Quit date: 10/19/1984    Years since quitting: 35.1  . Smokeless tobacco: Never Used  Substance Use Topics  . Alcohol use: Not Currently  . Drug use: Never    Home Medications Prior to Admission medications   Medication Sig Start Date End Date Taking? Authorizing Provider  albuterol (VENTOLIN HFA) 108 (90 Base) MCG/ACT inhaler Inhale two puffs every four to six hours as needed for cough or wheeze. 12/18/19   Tanda Rockers, MD  amoxicillin-clavulanate (AUGMENTIN) 875-125 MG tablet Take 1 tablet by mouth every 12 (twelve) hours. 12/18/19   Aydeen Blume, Wenda Overland, MD  aspirin 81 MG chewable tablet Chew 1 tablet by mouth daily.    [provider]  atorvastatin (LIPITOR) 40 MG tablet Take 20 tablets by mouth daily.  11/01/14   [provider]  Biotin 1000 MCG tablet Take 1 tablet by mouth daily.    [provider]  budesonide-formoterol (SYMBICORT) 160-4.5 MCG/ACT inhaler Inhale 2 puffs into the lungs twice daily with spacer. Rinse, gargle and spit after use. 10/25/19   Kozlow, Donnamarie Poag, MD  buPROPion (WELLBUTRIN XL) 150 MG 24 hr tablet Take 150 mg by mouth daily.    [provider]  Cholecalciferol (VITAMIN D3 PO) Take 2,000  Units by mouth daily.    [provider]  clonazePAM (KLONOPIN) 0.5 MG tablet Take 1 tablet by mouth daily as needed. 07/15/15   [provider]  DULoxetine (CYMBALTA) 60 MG capsule Take 1 capsule by mouth daily. 02/14/15   [provider]  EPINEPHrine 0.3 mg/0.3 mL IJ SOAJ injection Inject 0.3 mLs (0.3 mg total) into the muscle as needed for anaphylaxis. 09/05/19   Kozlow, Donnamarie Poag, MD  famotidine (PEPCID) 40 MG tablet Take 1 tablet (40 mg total) by mouth at bedtime. 08/22/19   Kozlow, Donnamarie Poag, MD  glucosamine-chondroitin 500-400 MG tablet Take 1 tablet by mouth daily.    [provider]  Hypertonic Nasal Wash (SINUS RINSE NA) Place into the nose daily.    [provider]    levothyroxine (SYNTHROID, LEVOTHROID) 25 MCG tablet Take 1 tablet by mouth daily. 07/15/15   [provider]  losartan (COZAAR) 25 MG tablet Take 1 tablet by mouth daily. 05/06/16   [provider]  montelukast (SINGULAIR) 10 MG tablet TAKE 1 TABLET BY MOUTH EVERYDAY AT BEDTIME 08/22/19   Kozlow, Donnamarie Poag, MD  nystatin (MYCOSTATIN/NYSTOP) powder APPLY TWICE DAILY AS NEEDED TO AFFECTED AREAS 09/07/18   [provider]  nystatin-triamcinolone ointment (MYCOLOG) APPLY TO AFFECTED AREA TWICE DAILY AS NEEDED 06/16/18   [provider]  omeprazole (PRILOSEC) 20 MG capsule Take 2 capsules (40 mg total) by mouth every morning. 08/22/19   Kozlow, Donnamarie Poag, MD  Respiratory Therapy Supplies (FLUTTER) DEVI Use as directed 03/31/18   Lauraine Rinne, NP  traZODone (DESYREL) 100 MG tablet Take 1 tablet by mouth at bedtime. 02/14/15   [provider]    Allergies    Olsalazine and Sulfamethoxazole  Review of Systems   Review of Systems  Respiratory: Positive for shortness of breath.    All other systems reviewed and are negative except that which was mentioned in HPI  Physical Exam Updated Vital Signs BP (!) 160/103 (BP Location: Right Arm)   Pulse 79   Temp 98.8 F (37.1 C) (Oral)   Resp (!) 27   Ht 4' 10"  (1.473 m)   Wt 86.6 kg   SpO2 94%   BMI 39.92 kg/m   Physical Exam Vitals and nursing note reviewed.  Constitutional:      General: She is not in acute distress.    Appearance: She is well-developed.  HENT:     Head: Normocephalic and atraumatic.  Eyes:     Conjunctiva/sclera: Conjunctivae normal.  Cardiovascular:     Rate and Rhythm: Normal rate and regular rhythm.     Heart sounds: Normal heart sounds. No murmur.  Pulmonary:     Effort: Pulmonary effort is normal.     Breath sounds: Normal breath sounds.     Comments: Speaking in full sentences, no distress Abdominal:     General: Bowel sounds are normal. There is no distension.      Palpations: Abdomen is soft.     Tenderness: There is no abdominal tenderness.  Musculoskeletal:     Cervical back: Neck supple.     Right lower leg: No edema.     Left lower leg: No edema.  Skin:    General: Skin is warm and dry.  Neurological:     Mental Status: She is alert and oriented to person, place, and time.     Comments: Fluent speech  Psychiatric:        Mood and Affect: Mood normal.  Judgment: Judgment normal.     ED Results / Procedures / Treatments   Labs (all labs ordered are listed, but only abnormal results are displayed) Labs Reviewed  [place]    EKG None  Radiology DG Chest 2 View  Result Date: 12/18/2019 CLINICAL DATA:  Dyspnea on exertion EXAM: CHEST - 2 VIEW COMPARISON:  Chest radiographs, 07/03/2019, CT chest, 05/27/2018 FINDINGS: The heart size and mediastinal contours are within normal limits. Unchanged subtle heterogeneous opacity of the lung bases. Disc degenerative disease of the thoracic spine. IMPRESSION: Unchanged subtle heterogeneous opacity of the lung bases, in keeping with scarring or atelectasis seen on prior CT. No new or acute appearing airspace opacity. Electronically Signed   By: Eddie Candle M.D.   On: 12/18/2019 16:45   CT Angio Chest PE W/Cm &/Or Wo Cm  Result Date: 12/18/2019 CLINICAL DATA:  Shortness of breath positive D-dimer, nonproductive cough for 1 week EXAM: CT ANGIOGRAPHY CHEST WITH CONTRAST TECHNIQUE: Multidetector CT imaging of the chest was performed using the standard protocol during bolus administration of intravenous contrast. Multiplanar CT image reconstructions and MIPs were obtained to evaluate the vascular anatomy. CONTRAST:  22m OMNIPAQUE IOHEXOL 350 MG/ML SOLN COMPARISON:  HRCT May 27, 2018 FINDINGS: Cardiovascular: Satisfactory opacification the pulmonary arteries to the segmental level. No pulmonary artery filling defects are identified. Central pulmonary arteries are  normal caliber. Mild cardiomegaly. Coronary artery calcifications are present. Trace pericardial fluid is likely within physiologic normal. Ascending thoracic aorta is normal caliber with scant atheromatous plaque. Proximal great vessels branch normally. Mediastinum/Nodes: No mediastinal fluid or gas. Normal thyroid gland and thoracic inlet. No acute abnormality of the esophagus. Scattered secretions noted in the trachea. No worrisome mediastinal, hilar or axillary adenopathy. Lungs/Pleura: Background of cylindrical varicose bronchiectasis in the lower lobes. Question some focal peribronchovascular thickening and possible consolidation in the right infrahilar region with scattered secretions and airways thickening. Dependent atelectatic changes noted posterior in the lung bases, right slightly greater than left likely related to central airways thickening and mucous plugging as well as accentuation by imaging during exhalation. More bandlike area of scarring and/or atelectasis present in the lingula. Upper Abdomen: Focal hook-like configuration of the celiac axis origin with slight poststenotic dilation up to 9 mm. Impression upon the vessel may reflect compression by the median arcuate ligament. Musculoskeletal: Multilevel degenerative changes are present in the imaged portions of the spine. Slight exaggeration of the thoracic kyphosis. Discogenic and facet degenerative changes are most in the lower cervical spine and upper lumbar levels. Review of the MIP images confirms the above findings. IMPRESSION: 1. No acute pulmonary artery filling defects identified to suggest pulmonary emboli. 2. Background of cylindrical varicose bronchiectasis in the lower lobes. Focal peribronchovascular thickening and possible consolidation in the right infrahilar region with scattered secretions and airways thickening. Correlate with clinical symptoms to exclude an acute infectious or inflammatory process. Recommend follow-up imaging  to document resolution with repeat CT in 6-8 weeks post intervention. 3. Hook like configuration of the celiac axis origin with slight poststenotic dilation up to 9 mm. Impression upon the vessel may reflect compression by the median arcuate ligament. 4. Aortic Atherosclerosis (ICD10-I70.0). 5. Coronary artery calcifications. Electronically Signed   By: PLovena LeM.D.   On: 12/18/2019 22:04   [place]Ven[place] Img Lower Bilateral (DVT)  Result Date: 12/18/2019 CLINICAL DATA:  Shortness of breath for 1 week with positive D-dimer EXAM: BILATERAL LOWER EXTREMITY VENOUS DOPPLER ULTRASOUND TECHNIQUE: Gray-scale sonography with  graded compression, as well as color Doppler and duplex ultrasound were performed to evaluate the lower extremity deep venous systems from the level of the common femoral vein and including the common femoral, femoral, profunda femoral, popliteal and calf veins including the posterior tibial, peroneal and gastrocnemius veins when visible. The superficial great saphenous vein was also interrogated. Spectral Doppler was utilized to evaluate flow at rest and with distal augmentation maneuvers in the common femoral, femoral and popliteal veins. COMPARISON:  None. FINDINGS: RIGHT LOWER EXTREMITY Common Femoral Vein: No evidence of thrombus. Normal compressibility, respiratory phasicity and response to augmentation. Saphenofemoral Junction: No evidence of thrombus. Normal compressibility and flow on color Doppler imaging. Profunda Femoral Vein: No evidence of thrombus. Normal compressibility and flow on color Doppler imaging. Femoral Vein: No evidence of thrombus. Normal compressibility, respiratory phasicity and response to augmentation. Popliteal Vein: No evidence of thrombus. Normal compressibility, respiratory phasicity and response to augmentation. Calf Veins: No evidence of thrombus. Normal compressibility and flow on color Doppler imaging. Superficial Great Saphenous Vein: No evidence of thrombus.  Normal compressibility. Venous Reflux:  None. Other Findings:  None. LEFT LOWER EXTREMITY Common Femoral Vein: No evidence of thrombus. Normal compressibility, respiratory phasicity and response to augmentation. Saphenofemoral Junction: No evidence of thrombus. Normal compressibility and flow on color Doppler imaging. Profunda Femoral Vein: No evidence of thrombus. Normal compressibility and flow on color Doppler imaging. Femoral Vein: No evidence of thrombus. Normal compressibility, respiratory phasicity and response to augmentation. Popliteal Vein: No evidence of thrombus. Normal compressibility, respiratory phasicity and response to augmentation. Calf Veins: No evidence of thrombus. Normal compressibility and flow on color Doppler imaging. Superficial Great Saphenous Vein: No evidence of thrombus. Normal compressibility. Venous Reflux:  None. Other Findings:  None. IMPRESSION: No evidence of deep venous thrombosis in either lower extremity. Electronically Signed   By: Inez Catalina M.D.   On: 12/18/2019 21:42    Procedures Procedures (including critical care time)  Medications Ordered in ED Medications  amoxicillin-clavulanate (AUGMENTIN) 875-125 MG per tablet 1 tablet (has no administration in time range)  iohexol (OMNIPAQUE) 350 MG/ML injection 75 mL (75 mLs Intravenous Contrast Given 12/18/19 2117)    ED Course  I have reviewed the triage vital signs and the nursing notes.  Pertinent labs & imaging results that were available during my care of the patient were reviewed by me and considered in my medical decision making (see chart for details).    MDM Rules/Calculators/A&P                      Hypertensive, afebrile, O2 sats ~95% on RA. No wheezing or crackles. No signs of volume overload.  I reviewed labs from clinic today which showed reassuring CBC, elevated D-dimer.  BMP and BNP reassuring here.  Obtain CTA to evaluate for PE.  CTA negative for PE but does show some bronchiectasis changes  and possible consolidation in right infrahilar lung.  Because of her recent worsening of shortness of breath symptoms and cough, discussed treatment with antibiotics and patient is in agreement with this plan. Per Dr. Gustavus Bryant request on clinic note, also obtained b/l LE [place] which were negative for DVT.  Patient instructed to follow-up in the pulmonology clinic.  Reviewed return precautions and she voiced understanding. Final Clinical Impression(s) / ED Diagnoses Final diagnoses:  SOB (shortness of breath)  Consolidation of right upper lobe of lung ([place])    Rx / DC Orders ED Discharge Orders  Ordered    amoxicillin-clavulanate (AUGMENTIN) 875-125 MG tablet  Every 12 hours     12/18/19 2338           Adriel Desrosier, Wenda Overland, MD 12/19/19 [phone]

## 2019-12-18 NOTE — Telephone Encounter (Signed)
Pt had OV today 12/18/19 with Dr. Melvyn Novas. Will sign off.

## 2019-12-22 ENCOUNTER — Encounter: Payer: Self-pay | Admitting: Pulmonary Disease

## 2019-12-22 ENCOUNTER — Other Ambulatory Visit: Payer: Self-pay

## 2019-12-22 ENCOUNTER — Ambulatory Visit (INDEPENDENT_AMBULATORY_CARE_PROVIDER_SITE_OTHER): Payer: Medicare Other | Admitting: Pulmonary Disease

## 2019-12-22 VITALS — BP 124/80 | HR 79 | Temp 97.7°F | Ht <= 58 in | Wt 194.0 lb

## 2019-12-22 DIAGNOSIS — R0600 Dyspnea, unspecified: Secondary | ICD-10-CM | POA: Diagnosis not present

## 2019-12-22 DIAGNOSIS — J329 Chronic sinusitis, unspecified: Secondary | ICD-10-CM | POA: Diagnosis not present

## 2019-12-22 DIAGNOSIS — J479 Bronchiectasis, uncomplicated: Secondary | ICD-10-CM | POA: Diagnosis not present

## 2019-12-22 DIAGNOSIS — R0609 Other forms of dyspnea: Secondary | ICD-10-CM

## 2019-12-22 NOTE — Assessment & Plan Note (Signed)
Plan: Continue with allergy asthma Continue Fasenra 4-week follow-up with [name]

## 2019-12-22 NOTE — Patient Instructions (Addendum)
You were seen today by Lauraine Rinne, NP  for:   It was nice meeting you today.  I am glad you are home from the emergency room.  Continue taking your Augmentin.  We will order a breathing test to further evaluate your breathing.  Restart using her flutter valve as discussed today.  Please try to produce a sputum sample so that we we can start testing your sputum.  We will see you back in about 4 weeks with an appointment with Dr. Viann Fish  1. DOE (dyspnea on exertion)  - Pulmonary function test; Future  Continue Symbicort 160 >>> 2 puffs in the morning right when you wake up, rinse out your mouth after use, 12 hours later 2 puffs, rinse after use >>> Take this daily, no matter what >>> This is not a rescue inhaler   Continue Singulair daily  Walk today stable in office without any oxygen desaturations  2. Obstructive bronchiectasis ([facility])  Bronchiectasis: This is the medical term which indicates that you have damage, dilated airways making you more susceptible to respiratory infection. Use a flutter valve 10 breaths twice a day or 4 to 5 breaths 4-5 times a day to help clear mucus out Let us know if you have cough with change in mucus color or fevers or chills.  At that point you would need an antibiotic. Maintain a healthy nutritious diet, eating whole foods Take your medications as prescribed   Please produce a sputum samples that way we can get this into the lab to test for cultures  We may consider repeating CT imaging on you sometime over the next 3 to 6 months  We recommend today:  Orders Placed This Encounter  Procedures  . Pulmonary function test    Standing Status:   Future    Standing Expiration Date:   12/21/2020    Order Specific Question:   Where should this test be performed?    Answer:   [facility]    Order Specific Question:   Full PFT: includes the following: basic spirometry, spirometry pre & post bronchodilator, diffusion capacity (DLCO), lung volumes     Answer:   Full PFT   Orders Placed This Encounter  Procedures  . Pulmonary function test   No orders of the defined types were placed in this encounter.   Follow Up:    Return in about 4 weeks (around 01/19/2020), or if symptoms worsen or fail to improve, for Follow up with Dr. Melvyn Novas.   Please do your part to reduce the spread of COVID-19:      Reduce your risk of any infection  and COVID19 by using the similar precautions used for avoiding the common cold or flu:  Marland Kitchen Wash your hands often with soap and warm water for at least 20 seconds.  If soap and water are not readily available, use an alcohol-based hand sanitizer with at least 60% alcohol.  . If coughing or sneezing, cover your mouth and nose by coughing or sneezing into the elbow areas of your shirt or coat, into a tissue or into your sleeve (not your hands). Langley Gauss A MASK when in public  . Avoid shaking hands with others and consider head nods or verbal greetings only. . Avoid touching your eyes, nose, or mouth with unwashed hands.  . Avoid close contact with people who are sick. . Avoid places or events with large numbers of people in one location, like concerts or sporting events. . If  you have some symptoms but not all symptoms, continue to monitor at home and seek medical attention if your symptoms worsen. . If you are having a medical emergency, call 911.   [place] / e-Visit: [email or web address]         [facility]: [facility]: [phone]                   [facility]: [phone]     It is flu season:   >>> Best ways to protect herself from the flu: Receive the yearly flu vaccine, practice good hand hygiene washing with soap and also using hand sanitizer when available, eat a nutritious meals, get adequate rest, hydrate appropriately   Please contact  the office if your symptoms worsen or you have concerns that you are not improving.   Thank you for choosing [facility] for your healthcare, and for allowing [place] to partner with you on your healthcare journey. I am thankful to be able to provide care to you today.   Wyn Quaker FNP-C

## 2019-12-22 NOTE — Assessment & Plan Note (Signed)
Patient shortness of breath is likely multifactorial:  Given patient's potential restrictive lung disease, morbid obesity, sedentary lifestyle, current bronchiectatic flare patient has multiple things that could contribute to the her dyspnea on exertion.  We will further evaluate this with a repeat breathing test Walk today in office, patient had no oxygen desaturations We will refer patient back to primary care to be considered for cardiology referral given coronary calcifications on Chest CTs.

## 2019-12-22 NOTE — Assessment & Plan Note (Signed)
Noncompliant with flutter valve Unsure if she has flutter valve at home No sputum cultures on file Currently taking Augmentin  Plan: Continue Augmentin Sputum cultures today 4-week follow-up with our office Resume flutter valve use May need to consider hypertonic saline nebs May need to consider vest for patient

## 2019-12-22 NOTE — Progress Notes (Signed)
@Patient  ID: Adriana White, female    DOB: [birth year], 74 y.o.   MRN: [id number]  Chief Complaint  Patient presents with  . Hospitalization Follow-up    Went to the ED on 3/1 for SOB and elevated d-dimer. States her breathing has not improved. She still has SOB and productive cough with dark green phlegm.     Referring provider: No ref. provider found  HPI:  74 year old female former smoker followed in our office for dyspnea on exertion, asthma, bronchiectasis  PMH: Obesity Smoker/ Smoking History: Former smoker.  Quit 1986.  25.5-pack-year smoking history Maintenance: Symbicort 160 Pt of: Dr. Melvyn Novas  12/22/2019  - Visit   74 year old female former smoker who was last seen in our office on 12/18/2019.  After that office visit she was evaluated for dyspnea on exertion.  D-dimer came back positive and patient was routed to the emergency room.  CTA chest negative.  Lower extremity Dopplers negative.  Patient diagnosed with pneumonia and started on Augmentin.  Patient continues to have productive cough.  Patient is fatigued.  Patient has a flutter valve at home but she has not been using it.  Patient reports adherence to her inhalers.  She is confused on why she is so short of breath.  Questionaires / Pulmonary Flowsheets:   ACT:  Asthma Control Test ACT Total Score  08/22/2019 19    MMRC: mMRC Dyspnea Scale mMRC Score  12/22/2019 1    Epworth:  No flowsheet data found.  Tests:   05/27/2018-CT maxillofacial-large mucous retention cyst/polyps within bilateral maxillary sinuses and left sphenoid sinus, moderate mucosal thickening of the maxillary sinuses and sphenoid sinuses, patent frontal sinus drainage pathways, sphenoid ethmoid recess recesses and left ostiomeatal unit, opacified right ostiomeatal unit, moderate rightward nasal septal deviation  05/27/2018-CT chest high-res-scattered areas of cylindrical and mild varicose bronchiectasis with associated postinfectious or inflammatory  scarring as above, aortic arthrosclerosis  12/18/2019-CT chest negative for PE, background of cylindrical varicose bronchiectasis in the lower lobes, focal peribronchovascular thickening and possible consolidation in the right infrahilar region with scattered secretions and airway thickening, recommend follow-up imaging to document resolution with a repeat CT in 6 to 8 weeks post intervention.  06/15/2018-pulmonary function test-FVC 2.10 (90% predicted), postbronchodilator ratio 75, postbronchodilator FEV1 1.46 (no bronchodilator response, DLCO 14.75 (87% predicted)  05/19/2018-respiratory allergy profile-elevations with dust mites, dog dander, Johnson grass, Aspergillus fumigatus, trees, ragweed, pigweed, largest elevations are with ragweed 05/19/2018-alpha-1 antitrypsin deficiency-phenotype: MM, 144 05/19/2018-eosinophils relative 6, eosinophils absolute 0.5  FENO:  No results found for: NITRICOXIDE  PFT: PFT Results Latest Ref Rng & Units 06/15/2018  FVC-Pre L 2.10  FVC-Predicted Pre % 90  FVC-Post L 1.95  FVC-Predicted Post % 84  Pre FEV1/FVC % % 70  Post FEV1/FCV % % 75  FEV1-Pre L 1.46  FEV1-Predicted Pre % 84  FEV1-Post L 1.46  DLCO UNC% % 89  DLCO COR %Predicted % 100  TLC L 4.36  TLC % Predicted % 103  RV % Predicted % 114    WALK:  SIX MIN WALK 12/22/2019  Supplimental Oxygen during Test? (L/min) No  Tech Comments: Patient was able to complete 1 lap. She was visibly SOB after the 1st lap and could not complete a 2nd lap. No O2 needed during or after walk.    Imaging: DG Chest 2 View  Result Date: 12/18/2019 CLINICAL DATA:  Dyspnea on exertion EXAM: CHEST - 2 VIEW COMPARISON:  Chest radiographs, 07/03/2019, CT chest, 05/27/2018 FINDINGS: The heart size and  mediastinal contours are within normal limits. Unchanged subtle heterogeneous opacity of the lung bases. Disc degenerative disease of the thoracic spine. IMPRESSION: Unchanged subtle heterogeneous opacity of the lung bases, in  keeping with scarring or atelectasis seen on prior CT. No new or acute appearing airspace opacity. Electronically Signed   By: Eddie Candle M.D.   On: 12/18/2019 16:45   CT Angio Chest PE W/Cm &/Or Wo Cm  Result Date: 12/18/2019 CLINICAL DATA:  Shortness of breath positive D-dimer, nonproductive cough for 1 week EXAM: CT ANGIOGRAPHY CHEST WITH CONTRAST TECHNIQUE: Multidetector CT imaging of the chest was performed using the standard protocol during bolus administration of intravenous contrast. Multiplanar CT image reconstructions and MIPs were obtained to evaluate the vascular anatomy. CONTRAST:  56m OMNIPAQUE IOHEXOL 350 MG/ML SOLN COMPARISON:  HRCT May 27, 2018 FINDINGS: Cardiovascular: Satisfactory opacification the pulmonary arteries to the segmental level. No pulmonary artery filling defects are identified. Central pulmonary arteries are normal caliber. Mild cardiomegaly. Coronary artery calcifications are present. Trace pericardial fluid is likely within physiologic normal. Ascending thoracic aorta is normal caliber with scant atheromatous plaque. Proximal great vessels branch normally. Mediastinum/Nodes: No mediastinal fluid or gas. Normal thyroid gland and thoracic inlet. No acute abnormality of the esophagus. Scattered secretions noted in the trachea. No worrisome mediastinal, hilar or axillary adenopathy. Lungs/Pleura: Background of cylindrical varicose bronchiectasis in the lower lobes. Question some focal peribronchovascular thickening and possible consolidation in the right infrahilar region with scattered secretions and airways thickening. Dependent atelectatic changes noted posterior in the lung bases, right slightly greater than left likely related to central airways thickening and mucous plugging as well as accentuation by imaging during exhalation. More bandlike area of scarring and/or atelectasis present in the lingula. Upper Abdomen: Focal hook-like configuration of the celiac axis origin  with slight poststenotic dilation up to 9 mm. Impression upon the vessel may reflect compression by the median arcuate ligament. Musculoskeletal: Multilevel degenerative changes are present in the imaged portions of the spine. Slight exaggeration of the thoracic kyphosis. Discogenic and facet degenerative changes are most in the lower cervical spine and upper lumbar levels. Review of the MIP images confirms the above findings. IMPRESSION: 1. No acute pulmonary artery filling defects identified to suggest pulmonary emboli. 2. Background of cylindrical varicose bronchiectasis in the lower lobes. Focal peribronchovascular thickening and possible consolidation in the right infrahilar region with scattered secretions and airways thickening. Correlate with clinical symptoms to exclude an acute infectious or inflammatory process. Recommend follow-up imaging to document resolution with repeat CT in 6-8 weeks post intervention. 3. Hook like configuration of the celiac axis origin with slight poststenotic dilation up to 9 mm. Impression upon the vessel may reflect compression by the median arcuate ligament. 4. Aortic Atherosclerosis (ICD10-I70.0). 5. Coronary artery calcifications. Electronically Signed   By: PLovena LeM.D.   On: 12/18/2019 22:04   [place] Img Lower Bilateral (DVT)  Result Date: 12/18/2019 CLINICAL DATA:  Shortness of breath for 1 week with positive D-dimer EXAM: BILATERAL LOWER EXTREMITY VENOUS DOPPLER ULTRASOUND TECHNIQUE: Gray-scale sonography with graded compression, as well as color Doppler and duplex ultrasound were performed to evaluate the lower extremity deep venous systems from the level of the common femoral vein and including the common femoral, femoral, profunda femoral, popliteal and calf veins including the posterior tibial, peroneal and gastrocnemius veins when visible. The superficial great saphenous vein was also interrogated. Spectral Doppler was utilized to evaluate flow at rest  and with distal augmentation maneuvers in the common  femoral, femoral and popliteal veins. COMPARISON:  None. FINDINGS: RIGHT LOWER EXTREMITY Common Femoral Vein: No evidence of thrombus. Normal compressibility, respiratory phasicity and response to augmentation. Saphenofemoral Junction: No evidence of thrombus. Normal compressibility and flow on color Doppler imaging. Profunda Femoral Vein: No evidence of thrombus. Normal compressibility and flow on color Doppler imaging. Femoral Vein: No evidence of thrombus. Normal compressibility, respiratory phasicity and response to augmentation. Popliteal Vein: No evidence of thrombus. Normal compressibility, respiratory phasicity and response to augmentation. Calf Veins: No evidence of thrombus. Normal compressibility and flow on color Doppler imaging. Superficial Great Saphenous Vein: No evidence of thrombus. Normal compressibility. Venous Reflux:  None. Other Findings:  None. LEFT LOWER EXTREMITY Common Femoral Vein: No evidence of thrombus. Normal compressibility, respiratory phasicity and response to augmentation. Saphenofemoral Junction: No evidence of thrombus. Normal compressibility and flow on color Doppler imaging. Profunda Femoral Vein: No evidence of thrombus. Normal compressibility and flow on color Doppler imaging. Femoral Vein: No evidence of thrombus. Normal compressibility, respiratory phasicity and response to augmentation. Popliteal Vein: No evidence of thrombus. Normal compressibility, respiratory phasicity and response to augmentation. Calf Veins: No evidence of thrombus. Normal compressibility and flow on color Doppler imaging. Superficial Great Saphenous Vein: No evidence of thrombus. Normal compressibility. Venous Reflux:  None. Other Findings:  None. IMPRESSION: No evidence of deep venous thrombosis in either lower extremity. Electronically Signed   By: Inez Catalina M.D.   On: 12/18/2019 21:42    Lab Results:  CBC    Component Value Date/Time     WBC 6.9 12/18/2019 1200   RBC 4.39 12/18/2019 1200   HGB 13.4 12/18/2019 1200   HGB 13.2 05/19/2018 1548   HCT 39.5 12/18/2019 1200   HCT 39.2 05/19/2018 1548   PLT 326.0 12/18/2019 1200   PLT 344 05/19/2018 1548   MCV 90.0 12/18/2019 1200   MCV 88 05/19/2018 1548   MCH 29.7 05/19/2018 1548   MCHC 34.0 12/18/2019 1200   RDW 13.7 12/18/2019 1200   RDW 14.3 05/19/2018 1548   LYMPHSABS 1.9 12/18/2019 1200   LYMPHSABS 2.0 05/19/2018 1548   MONOABS 0.4 12/18/2019 1200   EOSABS 0.0 12/18/2019 1200   EOSABS 0.5 (H) 05/19/2018 1548   BASOSABS 0.0 12/18/2019 1200   BASOSABS 0.0 05/19/2018 1548    BMET    Component Value Date/Time   NA 138 12/18/2019 2005   K 3.5 12/18/2019 2005   CL 104 12/18/2019 2005   CO2 24 12/18/2019 2005   GLUCOSE 93 12/18/2019 2005   BUN 12 12/18/2019 2005   CREATININE 0.76 12/18/2019 2005   CALCIUM 9.1 12/18/2019 2005   GFRNONAA >60 12/18/2019 2005   GFRAA >60 12/18/2019 2005    BNP    Component Value Date/Time   BNP 36.3 12/18/2019 2005    ProBNP    Component Value Date/Time   PROBNP 24.0 12/18/2019 1200    Specialty Problems      Pulmonary Problems   Obstructive bronchiectasis (HCC)    Onset of symptoms late 1990s dx by [facility] 03/11/2018   rec try symbicort 80 2bid and stop fosfamax - Quant Ig's  05/19/18 nl  - [facility] AT screen 05/19/18  MM/ level 144  - Allergy profile 05/04/2018 >  Eos 0.5/  IgE 159   RAST  Pos ragweed/grass/ dust/trees/dog/ dust  - 05/04/2018     > continue symb 80 2bid  -HRCT chest 05/2018 >scattered bronchiectasis , scarring  - PFT's  06/15/2018  FEV1 1.46 (84 % )  ratio 75  p 0 % improvement from saba p symb 80 x 2  prior to study with DLCO  89/89 % corrects to 100  % for alv volume   -  Kozlow eval  09/12/18 rec consider biologics  - 07/03/2019  After extensive coaching inhaler device,  effectiveness =    75% > resume symbicort and d/c all others x for alb prn  - 08/08/2019  After extensive coaching inhaler  device,  effectiveness =    75% (short Ti) > try off > worse as of 12/18/2019 rec restart symb 160 2bid        Cough    Allergy profile 05/04/2018 >  Eos 0.5/  IgE 159   RAST  Pos ragweed/grass/ dust/trees/dog/ dust          Chronic sinusitis with polyps    CT sinus 05/27/18  1. 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 2. Moderate mucosal thickening of the maxillary sinuses and the sphenoid sinuses. 3. Patent frontal sinus drainage pathways, sphenoid ethmoid recesses, and left ostiomeatal unit. 4. Opacified right ostiomeatal unit. 5. Moderate rightward nasal septal deviation>>  Referred to ENT > [name] 06/17/18 > rec surgery ? Ever done  -  Kozlow eval  09/12/18 rec consider biologics       DOE (dyspnea on exertion)    Acute change in baseline late Feb 2021          Allergies  Allergen Reactions  . Olsalazine Rash    Other reaction(s): Other (See Comments) unknown Pt. Not sure of reaction Pt. Not sure of reaction   . Sulfamethoxazole Rash    Patient not sure why. Patient not sure why.     Immunization History  Administered Date(s) Administered  . Fluad Quad(high Dose 65+) 08/08/2019  . Influenza Split 08/02/2016  . Influenza, High Dose Seasonal PF 07/08/2017, 07/21/2018  . Influenza,inj,Quad PF,6+ Mos 08/04/2016  . Influenza-Unspecified 08/02/2016  . Pneumococcal Conjugate-13 02/27/2014  . Pneumococcal Polysaccharide-23 09/06/2006, 05/30/2015  . Td 01/13/2017  . Tdap 01/13/2017  . Zoster 11/17/2011    Past Medical History:  Diagnosis Date  . Asthma   . Bronchiectasis ([place])   . Fatty liver   . Hyperlipidemia   . Hypertension   . OSA (obstructive sleep apnea)   . Ulcerative colitis ([place])     Tobacco History: Social History   Tobacco Use  Smoking Status Former Smoker  . Packs/day: 1.50  . Years: 17.00  . Pack years: 25.50  . Types: Cigarettes  . Quit date: 10/19/1984  . Years since  quitting: 35.1  Smokeless Tobacco Never Used   Counseling given: Not Answered   Continue to not smoke  Outpatient Encounter Medications as of 12/22/2019  Medication Sig  . albuterol (VENTOLIN HFA) 108 (90 Base) MCG/ACT inhaler Inhale two puffs every four to six hours as needed for cough or wheeze.  Marland Kitchen amoxicillin-clavulanate (AUGMENTIN) 875-125 MG tablet Take 1 tablet by mouth every 12 (twelve) hours.  Marland Kitchen aspirin 81 MG chewable tablet Chew 1 tablet by mouth daily.  Marland Kitchen atorvastatin (LIPITOR) 40 MG tablet Take 20 tablets by mouth daily.   . Biotin 1000 MCG tablet Take 1 tablet by mouth daily.  . budesonide-formoterol (SYMBICORT) 160-4.5 MCG/ACT inhaler Inhale 2 puffs into the lungs twice daily with spacer. Rinse, gargle and spit after use.  Marland Kitchen buPROPion (WELLBUTRIN XL) 150 MG 24 hr tablet Take 150 mg by mouth daily.  . Cholecalciferol (VITAMIN D3 PO)  Take 2,000 Units by mouth daily.  . clonazePAM (KLONOPIN) 0.5 MG tablet Take 1 tablet by mouth daily as needed.  . DULoxetine (CYMBALTA) 60 MG capsule Take 1 capsule by mouth daily.  Marland Kitchen EPINEPHrine 0.3 mg/0.3 mL IJ SOAJ injection Inject 0.3 mLs (0.3 mg total) into the muscle as needed for anaphylaxis.  . famotidine (PEPCID) 40 MG tablet Take 1 tablet (40 mg total) by mouth at bedtime.  Marland Kitchen glucosamine-chondroitin 500-400 MG tablet Take 1 tablet by mouth daily.  . Hypertonic Nasal Wash (SINUS RINSE NA) Place into the nose daily.  Marland Kitchen levothyroxine (SYNTHROID, LEVOTHROID) 25 MCG tablet Take 1 tablet by mouth daily.  Marland Kitchen losartan (COZAAR) 25 MG tablet Take 1 tablet by mouth daily.  . montelukast (SINGULAIR) 10 MG tablet TAKE 1 TABLET BY MOUTH EVERYDAY AT BEDTIME  . nystatin (MYCOSTATIN/NYSTOP) powder APPLY TWICE DAILY AS NEEDED TO AFFECTED AREAS  . nystatin-triamcinolone ointment (MYCOLOG) APPLY TO AFFECTED AREA TWICE DAILY AS NEEDED  . omeprazole (PRILOSEC) 20 MG capsule Take 2 capsules (40 mg total) by mouth every morning.  Marland Kitchen Respiratory Therapy Supplies  (FLUTTER) DEVI Use as directed  . traZODone (DESYREL) 100 MG tablet Take 1 tablet by mouth at bedtime.   No facility-administered encounter medications on file as of 12/22/2019.     Review of Systems  Review of Systems  Constitutional: Positive for fatigue. Negative for activity change and fever.  HENT: Negative for sinus pressure, sinus pain and sore throat.   Respiratory: Positive for shortness of breath. Negative for cough and wheezing.   Cardiovascular: Negative for chest pain and palpitations.  Gastrointestinal: Negative for diarrhea, nausea and vomiting.  Musculoskeletal: Negative for arthralgias.  Neurological: Negative for dizziness.  Psychiatric/Behavioral: Negative for sleep disturbance. The patient is not nervous/anxious.      Physical Exam  BP 124/80   Pulse 79   Temp 97.7 F (36.5 C) (Temporal)   Ht 4' 10"  (1.473 m)   Wt 194 lb (88 kg)   SpO2 96% Comment: on RA  BMI 40.55 kg/m   Wt Readings from Last 5 Encounters:  12/22/19 194 lb (88 kg)  12/18/19 191 lb (86.6 kg)  12/18/19 192 lb (87.1 kg)  11/08/19 189 lb (85.7 kg)  08/22/19 192 lb 3.2 oz (87.2 kg)    BMI Readings from Last 5 Encounters:  12/22/19 40.55 kg/m  12/18/19 39.92 kg/m  12/18/19 40.13 kg/m  11/08/19 40.90 kg/m  08/22/19 40.45 kg/m     Physical Exam Vitals and nursing note reviewed.  Constitutional:      General: She is not in acute distress.    Appearance: Normal appearance. She is obese.  HENT:     Head: Normocephalic and atraumatic.     Right Ear: Tympanic membrane, ear canal and external ear normal. There is no impacted cerumen.     Left Ear: Ear canal and external ear normal. There is no impacted cerumen.     Nose: Nose normal. No congestion.     Mouth/Throat:     Mouth: Mucous membranes are moist.     Pharynx: Oropharynx is clear.  Eyes:     Pupils: Pupils are equal, round, and reactive to light.  Cardiovascular:     Rate and Rhythm: Normal rate and regular rhythm.      Pulses: Normal pulses.     Heart sounds: Normal heart sounds. No murmur.  Pulmonary:     Effort: Pulmonary effort is normal. No respiratory distress.     Breath sounds: No decreased  air movement. Examination of the right-upper field reveals rhonchi. Examination of the right-lower field reveals decreased breath sounds. Examination of the left-lower field reveals decreased breath sounds. Decreased breath sounds and rhonchi present. No wheezing or rales.  Abdominal:     General: Abdomen is flat. Bowel sounds are normal.     Palpations: Abdomen is soft.  Musculoskeletal:     Cervical back: Normal range of motion.  Skin:    General: Skin is warm and dry.     Capillary Refill: Capillary refill takes less than 2 seconds.  Neurological:     General: No focal deficit present.     Mental Status: She is alert and oriented to person, place, and time. Mental status is at baseline.     Gait: Gait normal.  Psychiatric:        Mood and Affect: Mood normal.        Behavior: Behavior normal.        Thought Content: Thought content normal.        Judgment: Judgment normal.       Assessment & Plan:   Chronic sinusitis with polyps Plan: Continue with allergy asthma Continue Fasenra 4-week follow-up with Dr. Melvyn Novas  Obstructive bronchiectasis [facility]) Noncompliant with flutter valve Unsure if she has flutter valve at home No sputum cultures on file Currently taking Augmentin  Plan: Continue Augmentin Sputum cultures today 4-week follow-up with our office Resume flutter valve use May need to consider hypertonic saline nebs May need to consider vest for patient   DOE (dyspnea on exertion) Patient shortness of breath is likely multifactorial:  Given patient's potential restrictive lung disease, morbid obesity, sedentary lifestyle, current bronchiectatic flare patient has multiple things that could contribute to the her dyspnea on exertion.  We will further evaluate this with a repeat  breathing test Walk today in office, patient had no oxygen desaturations We will refer patient back to primary care to be considered for cardiology referral given coronary calcifications on Chest CTs.    Return in about 4 weeks (around 01/19/2020), or if symptoms worsen or fail to improve, for Follow up with Dr. Melvyn Novas.   Lauraine Rinne, NP 12/22/2019   This appointment required 32 minutes of patient care (this includes precharting, chart review, review of results, face-to-face care, etc.).

## 2019-12-25 ENCOUNTER — Telehealth: Payer: Self-pay | Admitting: Pulmonary Disease

## 2019-12-25 MED ORDER — AMOXICILLIN-POT CLAVULANATE 875-125 MG PO TABS: 1.0000 | ORAL_TABLET | Freq: Two times a day (BID) | ORAL | 0 refills | Status: DC

## 2019-12-25 NOTE — Telephone Encounter (Signed)
I called and spoke with the pt  She was seen by Adriana White 12/22/19 and given the following AVS instructions:  Instructions    Return in about 4 weeks (around 01/19/2020), or if symptoms worsen or fail to improve, for Follow up with Dr. Sherene White. You were seen today by Adriana Ceo, NP  for:   It was nice meeting you today.  I am glad you are home from the emergency room.  Continue taking your Augmentin.  We will order a breathing test to further evaluate your breathing.  Restart using her flutter valve as discussed today.  Please try to produce a sputum sample so that we we can start testing your sputum.  We will see you back in about 4 weeks with an appointment with Dr. Ludger White  1. DOE (dyspnea on exertion)  - Pulmonary function test; Future  Continue Symbicort 160 >>> 2 puffs in the morning right when you wake up, rinse out your mouth after use, 12 hours later 2 puffs, rinse after use >>> Take this daily, no matter what >>> This is not a rescue inhaler   Continue Singulair daily  Walk today stable in office without any oxygen desaturations  2. Obstructive bronchiectasis (HCC)  Bronchiectasis: This is the medical term which indicates that you have damage, dilated airways making you more susceptible to respiratory infection. Use a flutter valve 10 breaths twice a day or 4 to 5 breaths 4-5 times a day to help clear mucus out Let us know if you have cough with change in mucus color or fevers or chills.  At that point you would need an antibiotic. Maintain a healthy nutritious diet, eating whole foods Take your medications as prescribed   Please produce a sputum samples that way we can get this into the lab to test for cultures  We may consider repeating CT imaging on you sometime over the next 3 to 6 months      She states has one dose of augmentin remaining  She wants to know if she needs more b/c although her breathing is much improved she states "it's too hard to  tell" if she is back at her baseline  She states not having any new symptoms and is using her flutter valve daily as instructed along with singulair and symbicort  She denies any f/c/s, wheezing, chest tightness   She is asking what type of PNA Adriana White thinks she may have and if she is contagious or not  I advised that her leaving a sputum sample as Adriana White instructed would help [place] figure out the answer to if she is growing any bacteria  She states will do this tomorrow  Please advise thanks!

## 2019-12-25 NOTE — Telephone Encounter (Signed)
I am glad that she is clinically improving.  Given her history of bronchiectasis I do not think it is unreasonable to extend her antibiotic dosing by 3 more days.  Okay to prescribe:  Augmentin >>> Take 1 875-125 mg tablet every 12 hours for the next 3 days >>> Take with food   This will extend her coverage to essentially give her 10 days of coverage for Augmentin.  Yes the sputum culture will help [place] identify what bacterial organism may be causing her pneumonia that is correct.  Keep follow-up with our office with her appointment with [name] on 01/23/2020 at 1345.  Elisha Headland, FNP

## 2019-12-25 NOTE — Telephone Encounter (Signed)
Called and spoke with pt letting her know the info stated by Aaron Edelman that we were going to extend her augmentin x3 days. Pt verbalized understanding. Verified pt's preferred pharmacy and sent rx in for her. Nothing further needed.

## 2019-12-26 ENCOUNTER — Other Ambulatory Visit: Payer: Medicare Other

## 2019-12-26 DIAGNOSIS — J479 Bronchiectasis, uncomplicated: Secondary | ICD-10-CM

## 2020-01-01 DIAGNOSIS — J4550 Severe persistent asthma, uncomplicated: Secondary | ICD-10-CM | POA: Diagnosis not present

## 2020-01-02 ENCOUNTER — Other Ambulatory Visit: Payer: Self-pay

## 2020-01-02 ENCOUNTER — Encounter: Payer: Self-pay | Admitting: Allergy and Immunology

## 2020-01-02 ENCOUNTER — Ambulatory Visit (INDEPENDENT_AMBULATORY_CARE_PROVIDER_SITE_OTHER): Payer: Medicare Other | Admitting: Allergy and Immunology

## 2020-01-02 VITALS — BP 148/86 | HR 88 | Temp 97.8°F | Resp 20

## 2020-01-02 DIAGNOSIS — J324 Chronic pansinusitis: Secondary | ICD-10-CM

## 2020-01-02 DIAGNOSIS — J479 Bronchiectasis, uncomplicated: Secondary | ICD-10-CM | POA: Diagnosis not present

## 2020-01-02 DIAGNOSIS — J3089 Other allergic rhinitis: Secondary | ICD-10-CM

## 2020-01-02 DIAGNOSIS — G4733 Obstructive sleep apnea (adult) (pediatric): Secondary | ICD-10-CM

## 2020-01-02 DIAGNOSIS — K219 Gastro-esophageal reflux disease without esophagitis: Secondary | ICD-10-CM

## 2020-01-02 DIAGNOSIS — J4550 Severe persistent asthma, uncomplicated: Secondary | ICD-10-CM | POA: Diagnosis not present

## 2020-01-02 DIAGNOSIS — D7219 Other eosinophilia: Secondary | ICD-10-CM

## 2020-01-02 MED ORDER — BENRALIZUMAB 30 MG/ML ~~LOC~~ SOSY
30.0000 mg | PREFILLED_SYRINGE | Freq: Once | SUBCUTANEOUS | Status: AC
  Administered 2020-01-02: 30 mg via SUBCUTANEOUS

## 2020-01-02 NOTE — Patient Instructions (Addendum)
  1.  Allergen avoidance measures - house dust mite, dog  2.  Continue to Treat and prevent inflammation:   A.  Symbicort 160-2 inhalations twice a day with spacer  B.  OTC Nasacort- 2 sprays each nostril 1 time per day  C.  Montelukast 10 mg - 1 tablet 1 time per day  D.  Fasenra / benralizumab injections injections consistently  3.  Continue to treat and prevent reflux:   A.  Omeprazole 40 mg tablet in a.m.  B.  Famotidine 40 mg tablet in p.m.  4.  If needed:   A.  Albuterol MDI-2 inhalations every 4-6 hours  B.  Nasal saline  C.  OTC antihistamine  5.  Use CPAP machine consistently  6.  Return to 12 weeks or earlier if problem

## 2020-01-02 NOTE — Progress Notes (Signed)
[facility] [place] [facility] [place]   Follow-up Note  Referring Provider: Perrin Smack, * Primary Provider: Patient, No Pcp Per Date of Office Visit: 01/02/2020  Subjective:   Adriana White (DOB: November 20, 1945) is a 74 y.o. female who returns to the [facility] [place] on 01/02/2020 in re-evaluation of the following:  HPI: Kazzandra presents to this clinic in evaluation of severe asthma and bronchiectasis and chronic sinusitis and allergic rhinitis and LPR.  Her last visit to this clinic was 10 October 2019.  We have had a very difficult time having Natasha consistently use her medical therapy especially her benralizumab injections.  She has a rather significant memory impairment which may be tied up with untreated sleep apnea.  Overall she believes that her airway has done relatively well since I have last seen her in this clinic.  She did require an antibiotic for a "pneumonia" which manifested itself as shortness of breath and apparently had evaluation for a pulmonary embolus which was negative in March 2021.  Since receiving the antibiotic she is much better at this point in time.  She still continues to use Symbicort and Nasacort and montelukast on a consistent basis and overall thought her chest and nose was doing well until this most recent event.  She still continues to have some cough and makes phlegm production on a regular basis.  Her reflux is under very good control at this point in time while using omeprazole and famotidine in combination.  She did have follow-up regarding her sleep apnea evaluation and indeed she does have sleep apnea and she has a CPAP machine and she has a mask but she does not have a hose.  She did contact someone recently about receiving a new hose and filters so she can start her CPAP machine.  She has received the flu vaccine and she has received 2 Covid vaccinations with the [place] product.  Allergies as of 01/02/2020       Reactions   Olsalazine Rash   Other reaction(s): Other (See Comments) unknown Pt. Not sure of reaction Pt. Not sure of reaction   Sulfamethoxazole Rash   Patient not sure why. Patient not sure why.      Medication List      albuterol 108 (90 Base) MCG/ACT inhaler Commonly known as: VENTOLIN HFA Inhale two puffs every four to six hours as needed for cough or wheeze.   aspirin 81 MG chewable tablet Chew 1 tablet by mouth daily.   atorvastatin 40 MG tablet Commonly known as: LIPITOR Take 20 tablets by mouth daily.   Biotin 1000 MCG tablet Take 1 tablet by mouth daily.   budesonide-formoterol 160-4.5 MCG/ACT inhaler Commonly known as: Symbicort Inhale 2 puffs into the lungs twice daily with spacer. Rinse, gargle and spit after use.   buPROPion 150 MG 24 hr tablet Commonly known as: WELLBUTRIN XL Take 150 mg by mouth daily.   clonazePAM 0.5 MG tablet Commonly known as: KLONOPIN Take 1 tablet by mouth daily as needed.   DULoxetine 60 MG capsule Commonly known as: CYMBALTA Take 1 capsule by mouth daily.   EPINEPHrine 0.3 mg/0.3 mL Soaj injection Commonly known as: EPI-PEN Inject 0.3 mLs (0.3 mg total) into the muscle as needed for anaphylaxis.   famotidine 40 MG tablet Commonly known as: PEPCID Take 1 tablet (40 mg total) by mouth at bedtime.   Flutter Devi Use as directed   glucosamine-chondroitin 500-400 MG tablet Take 1  tablet by mouth daily.   levothyroxine 25 MCG tablet Commonly known as: SYNTHROID Take 1 tablet by mouth daily.   losartan 25 MG tablet Commonly known as: COZAAR Take 1 tablet by mouth daily.   montelukast 10 MG tablet Commonly known as: SINGULAIR TAKE 1 TABLET BY MOUTH EVERYDAY AT BEDTIME   nystatin powder Commonly known as: MYCOSTATIN/NYSTOP APPLY TWICE DAILY AS NEEDED TO AFFECTED AREAS   nystatin-triamcinolone ointment Commonly known as: MYCOLOG APPLY TO AFFECTED AREA TWICE DAILY AS NEEDED   omeprazole 20 MG  capsule Commonly known as: PRILOSEC Take 2 capsules (40 mg total) by mouth every morning.   SINUS RINSE NA Place into the nose daily.   traZODone 100 MG tablet Commonly known as: DESYREL Take 1 tablet by mouth at bedtime.   VITAMIN D3 PO Take 2,000 Units by mouth daily.       Past Medical History:  Diagnosis Date  . Asthma   . Bronchiectasis ([place])   . Fatty liver   . Hyperlipidemia   . Hypertension   . OSA (obstructive sleep apnea)   . Ulcerative colitis [facility])     Past Surgical History:  Procedure Laterality Date  . ABDOMINAL HERNIA REPAIR    . ABDOMINAL PERINEAL BOWEL RESECTION    . CESAREAN SECTION    . TONSILLECTOMY    . UTERINE FIBROID SURGERY      Review of systems negative except as noted in HPI / PMHx or noted below:  Review of Systems  Constitutional: Negative.   HENT: Negative.   Eyes: Negative.   Respiratory: Negative.   Cardiovascular: Negative.   Gastrointestinal: Negative.   Genitourinary: Negative.   Musculoskeletal: Negative.   Skin: Negative.   Neurological: Negative.   Endo/Heme/Allergies: Negative.   Psychiatric/Behavioral: Negative.      Objective:   Vitals:   01/02/20 1357  BP: (!) 148/86  Pulse: 88  Resp: 20  Temp: 97.8 F (36.6 C)  SpO2: 93%          Physical Exam Constitutional:      Appearance: She is not diaphoretic.  HENT:     Head: Normocephalic.     Right Ear: Tympanic membrane, ear canal and external ear normal.     Left Ear: Tympanic membrane, ear canal and external ear normal.     Nose: Nose normal. No mucosal edema or rhinorrhea.     Mouth/Throat:     Pharynx: Uvula midline. No oropharyngeal exudate.  Eyes:     Conjunctiva/sclera: Conjunctivae normal.  Neck:     Thyroid: No thyromegaly.     Trachea: Trachea normal. No tracheal tenderness or tracheal deviation.  Cardiovascular:     Rate and Rhythm: Normal rate and regular rhythm.     Heart sounds: Normal heart sounds, S1 normal and S2 normal. No  murmur.  Pulmonary:     Effort: No respiratory distress.     Breath sounds: Normal breath sounds. No stridor. No wheezing or rales.  Lymphadenopathy:     Head:     Right side of head: No tonsillar adenopathy.     Left side of head: No tonsillar adenopathy.     Cervical: No cervical adenopathy.  Skin:    Findings: No erythema or rash.     Nails: There is no clubbing.  Neurological:     Mental Status: She is alert.     Diagnostics:    Spirometry was performed and demonstrated an FEV1 of 1.08 at 65 % of predicted.  The patient had  an Asthma Control Test with the following results: ACT Total Score: 13.    Results of a chest CT scan angiography obtained 18 December 2019 identified the following:  Cardiovascular: Satisfactory opacification the pulmonary arteries to the segmental level. No pulmonary artery filling defects are identified. Central pulmonary arteries are normal caliber. Mild cardiomegaly. Coronary artery calcifications are present. Trace pericardial fluid is likely within physiologic normal. Ascending thoracic aorta is normal caliber with scant atheromatous plaque. Proximal great vessels branch normally.  Mediastinum/Nodes: No mediastinal fluid or gas. Normal thyroid gland and thoracic inlet. No acute abnormality of the esophagus. Scattered secretions noted in the trachea. No worrisome mediastinal, hilar or axillary adenopathy.  Lungs/Pleura: Background of cylindrical varicose bronchiectasis in the lower lobes. Question some focal peribronchovascular thickening and possible consolidation in the right infrahilar region with scattered secretions and airways thickening. Dependent atelectatic changes noted posterior in the lung bases, right slightly greater than left likely related to central airways thickening and mucous plugging as well as accentuation by imaging during exhalation. More bandlike area of scarring and/or atelectasis present in the lingula.  Assessment  and Plan:   1. Not well controlled severe persistent asthma   2. Obstructive bronchiectasis ([place])   3. Chronic pansinusitis   4. Perennial allergic rhinitis   5. LPRD (laryngopharyngeal reflux disease)   6. Other eosinophilia   7. Obstructive sleep apnea syndrome     1.  Allergen avoidance measures - house dust mite, dog  2.  Continue to Treat and prevent inflammation:   A.  Symbicort 160-2 inhalations twice a day with spacer  B.  OTC Nasacort- 2 sprays each nostril 1 time per day  C.  Montelukast 10 mg - 1 tablet 1 time per day  D.  Fasenra / benralizumab injections injections consistently  3.  Continue to treat and prevent reflux:   A.  Omeprazole 40 mg tablet in a.m.  B.  Famotidine 40 mg tablet in p.m.  4.  If needed:   A.  Albuterol MDI-2 inhalations every 4-6 hours  B.  Nasal saline  C.  OTC antihistamine  5.  Use CPAP machine consistently  6.  Return to 12 weeks or earlier if problem  We need to make sure that Carlisia uses her medications on a consistent basis including consistent use of benralizumab injection given her preexistent inflammatory lung disease associated with eosinophilia.  And she obviously needs to consistently use her CPAP machine hopefully in an attempt to correct her rather significant memory dysfunction.  She will remain on the plan of action noted above which includes anti-inflammatory agents for airway and therapy directed against reflux and I will see her back in his clinic in 12 weeks or earlier if there is a problem.  Allena Katz, MD Allergy / Immunology [place]

## 2020-01-03 ENCOUNTER — Encounter: Payer: Self-pay | Admitting: Allergy and Immunology

## 2020-01-03 ENCOUNTER — Ambulatory Visit: Payer: Medicare Other

## 2020-01-04 ENCOUNTER — Other Ambulatory Visit: Payer: Self-pay | Admitting: Allergy and Immunology

## 2020-01-10 NOTE — Progress Notes (Signed)
Patient identification verified. Patient given recent lab results. Per Elisha Headland, sputum cultures are showing light growth of pseudomonas. Patient reports feeling much better. She has a follow up appointment with [name] and will call if her symptoms return. Patient verbalized understanding of results and ongoing plan of care.

## 2020-01-23 ENCOUNTER — Ambulatory Visit: Payer: Medicare Other | Admitting: Internal Medicine

## 2020-01-26 LAB — FUNGUS CULTURE W SMEAR
MICRO NUMBER:: 10230941
SMEAR:: NONE SEEN
SPECIMEN QUALITY:: ADEQUATE

## 2020-01-26 LAB — RESPIRATORY CULTURE OR RESPIRATORY AND SPUTUM CULTURE
MICRO NUMBER:: 10230942
RESULT:: NORMAL
SPECIMEN QUALITY:: ADEQUATE

## 2020-01-30 ENCOUNTER — Ambulatory Visit (INDEPENDENT_AMBULATORY_CARE_PROVIDER_SITE_OTHER): Payer: Medicare Other | Admitting: Internal Medicine

## 2020-01-30 ENCOUNTER — Other Ambulatory Visit: Payer: Self-pay

## 2020-01-30 ENCOUNTER — Encounter: Payer: Self-pay | Admitting: Internal Medicine

## 2020-01-30 DIAGNOSIS — J479 Bronchiectasis, uncomplicated: Secondary | ICD-10-CM | POA: Diagnosis not present

## 2020-01-30 NOTE — Patient Instructions (Signed)
No change in medications  Pulmonary follow up is as needed

## 2020-01-30 NOTE — Progress Notes (Signed)
Subjective:     Patient ID: Adriana White, female   DOB: [birth year],    MRN: [id number]  Brief patient profile:  25 yowf  MM From [place] Quit smoking in 1986 when first and last baby born with baseline of 160 then around  Late  1990s  noted cough and eval by [facility] pulmonologist and eventually dx'd bronchiectasis placed  On advair seemed to help symtoms of sob/ cough initially but worse since around 12/2017 so self referred to pulmonary clinic 03/11/2018      History of Present Illness  03/11/2018 [facility]/ [facility]   Chief Complaint  Patient presents with  . Pulmonary Consult    Self referral. She states dxed with bronchiectasis approx 20 yrs ago. She c/o cough with tan sputum and increased SOB over the past 2 months. She states she gets SOB walking 1/2 a block.   pt with bronchiectasis worse cough x 2 months esp hs  With thick brown mucus    s assoc with nasal congestion and overt HB on fosfamax and ppi  but  no cp and never had hemoptysis with flares in past  Sob walking dog esp hills @ moderate pace  rec Stop advair and alendronate for now Augmentin 875 mg take one pill twice daily  X 10 days - take at breakfast and supper with large glass of water.  It would help reduce the usual side effects (diarrhea and yeast infections) if you ate cultured yogurt at lunch.  Symbicort 80 Take 2 puffs first thing in am and then another 2 puffs about 12 hours later.  Work on inhaler technique:  relax and gently blow all the way out then take a nice smooth deep breath back in, triggering the inhaler at same time you start breathing in.  Hold for up to 5 seconds if you can. Blow out thru nose. Rinse and gargle with water when done Change Omeprazole to take  20 mg x 30- 60 min before your first and last meals of the day  GERD diet  For cough/ congestion  > mucinex or mucinex dm up  To 1200 mg every 12 hours as needed Please schedule a follow up office visit in 2 weeks to see one our  NPS, sooner if needed  with all medications /inhalers/ solutions in hand so we can verify exactly what you are taking. This includes all medications from all doctors and over the counters - set up for pfts on return to see me in 6 weeks from now  - also needs f/u cxr and Ig's and alpha one screen      05/04/2018  f/u ov/Wert re: bronchiectasis  / did not bring meds / did not schedule pfts  Chief Complaint  Patient presents with  . Acute Visit    Pt c/o "stuffed up" and prod cough with tan sputum 6 days.     breathing is better, able to walk dog easier but cough no better and more brown mucus x one week prior to OV   Overusing saba  rec Symbicort 80 Take 2 puffs first thing in am and then another 2 puffs about 12 hours later.  Work on inhaler technique:  Change Omeprazole to take  20 mg x 30- 60 min before your first and last meals of the day  GERD (REFLUX)  is an extremely common cause of respiratory symptoms just like yours , many times with no obvious heartburn at all.  For cough/ congestion  >  mucinex or mucinex dm up  To 1200 mg every 12 hours as needed         06/15/2018  f/u ov/Wert re: bronchiectasis/ sinusitis  Chief Complaint  Patient presents with  . Follow-up    PFT's done today. Breathing has improved slightly- relates to using sinus rinse daily.    Dyspnea:  Walking dog / shopping ok  = MMRC2 = can't walk a nl pace on a flat grade s sob but does fine slow and flat e Cough: esp in am but also thruout the day, seening ent 06/17/18 > beige mucus Sleeping: flat  rec No change in medications and keep appt to see Dr [name] - if he feels allergies are an issue I will refer you to Dr [name] group        07/03/2019  f/u ov/Wert re: obst bronchiectasis, sob and cough  Chief Complaint  Patient presents with  . Acute Visit    F/U breathing, increased SOB. Has several questions about breathing.   Dyspnea:  Room to room mostly limited though by coughing fits  Cough: coughs so  hard can't catch her breath, assoc with nasal congest but no purulent secretions  Sleeping: one bed block ok  SABA use: not sure what inhalers she uses, thoroughly confused with details of care 02: no  rec Try prilosec  Take 30-60 min before first meal of the day and Pepcid ac (famotidine) 20 mg one after supper Plan A = Automatic = Symbicort 80 Take 2 puffs first thing in am and then another 2 puffs about 12 hours later.  STOP  spiriva advair and fish oil for now  Work on inhaler technique:   Plan B = Backup Only use your albuterol inhaler as a rescue medication Please schedule a follow up office visit in 2 weeks, call sooner if needed with all medications /inhalers/ solutions in hand so we can verify exactly what you are taking. This includes all medications from all doctors and over the [place] separate them into two bags:  the ones you take automatically, no matter what, vs the ones you take just when you feel you need them "BAG #2 is UP TO YOU"  - this will really help [place] help you take your medications more effectively.  >>> has one symb sample    08/08/2019  f/u ov/Wert re: obst bronchiectasis on symb 80 2bid poor adherence (did not use sample up yet and having trouble affording rx)  Chief Complaint  Patient presents with  . Follow-up    Breathing is about the same. She rarely uses her albuterol.  Dyspnea:  Walking dog around neighborhood some hills  Cough: better but still aggravated at hs for just a few min then sleeps fine Sleeping: able to sleep on bed blocks  SABA use: rarely  02: no  rec Try prilosec  Take 30-60 min before first meal of the day and Pepcid ac (famotidine) 20 mg one after supper Plan A = Automatic = Symbicort 80 Take 2 puffs first thing in am and then another 2 puffs about 12 hours later.  STOP  spiriva advair and fish oil for now  Work on inhaler technique:     Plan B = Backup Only use your albuterol inhaler     11/08/2019  f/u ov/Wert re:  obst  bronchiectasis, now f/b Dr [name] on allergy shots Chief Complaint  Patient presents with  . Follow-up    Breathing is doing well and no new  co's. She states has not had to use her rescue inhaler. She has cough with tan colored sputum.   Dyspnea:  Still walking dog, some hills Cough: worse at hs  Sleeping: sev times a night /hob up on bed blocks  SABA use: no rescue nor maintenance 02: none rec F/u Kozlow ok continue off symbicort trial basis     12/18/2019  f/u ov/Wert re: obst bronchiectasis worse doe off symbicort gradually but esp last 6 days  Chief Complaint  Patient presents with  . Acute Visit    Increased SOB x 6 days. She states she has never used her albuterol.   Dyspnea:  X  75 ft flat surface/ no longer walking dog (was doing x 15 min and stopping half way)  Cough: no change in cough  Sleeping: bed blocks , no resp symptoms  SABA use: not using  02: none  rec Restart symbiocort 160  Take 2 puffs first thing in am and then another 2 puffs about 12 hours later.  Only use your albuterol as a rescue medication     01/30/2020  f/u ov/Wert re: obstructive  bronchiectasis  Chief Complaint  Patient presents with  . Follow-up    Breathing is doing well today. She is coughing some with clear sputum. She is using her albuterol inhaler rarely.   Dyspnea:  Back walking dog x 15 min some hills Cough: clear mucus  Sleeping: bed blocks  SABA use: albuterol rarely  02: none    No obvious day to day or daytime variability or assoc excess/ purulent sputum or mucus plugs or hemoptysis or cp or chest tightness, subjective wheeze or overt sinus or hb symptoms.   Sleeping ok as above  without nocturnal  or early am exacerbation  of respiratory  c/o's or need for noct saba. Also denies any obvious fluctuation of symptoms with weather or environmental changes or other aggravating or alleviating factors except as outlined above   No unusual exposure hx or h/o childhood pna/ asthma or  knowledge of premature birth.  Current Allergies, Complete Past Medical History, Past Surgical History, Family History, and Social History were reviewed in [employer] record.  ROS  The following are not active complaints unless bolded Hoarseness, sore throat, dysphagia, dental problems, itching, sneezing,  nasal congestion or discharge of excess mucus or purulent secretions, ear ache,   fever, chills, sweats, unintended wt loss or wt gain, classically pleuritic or exertional cp,  orthopnea pnd or arm/hand swelling  or leg swelling, presyncope, palpitations, abdominal pain, anorexia, nausea, vomiting, diarrhea  or change in bowel habits or change in bladder habits, change in stools or change in urine, dysuria, hematuria,  rash, arthralgias, visual complaints, headache, numbness, weakness or ataxia or problems with walking or coordination,  change in mood or  memory.        Current Meds  Medication Sig  . albuterol (VENTOLIN HFA) 108 (90 Base) MCG/ACT inhaler Inhale two puffs every four to six hours as needed for cough or wheeze.  Marland Kitchen aspirin 81 MG chewable tablet Chew 1 tablet by mouth daily.  Marland Kitchen atorvastatin (LIPITOR) 40 MG tablet Take 20 tablets by mouth daily.   . Biotin 1000 MCG tablet Take 1 tablet by mouth daily.  . budesonide-formoterol (SYMBICORT) 160-4.5 MCG/ACT inhaler Inhale 2 puffs into the lungs twice daily with spacer. Rinse, gargle and spit after use.  Marland Kitchen buPROPion (WELLBUTRIN XL) 150 MG 24 hr tablet Take 150 mg by mouth daily.  Marland Kitchen  Cholecalciferol (VITAMIN D3 PO) Take 2,000 Units by mouth daily.  . clonazePAM (KLONOPIN) 0.5 MG tablet Take 1 tablet by mouth daily as needed.  . DULoxetine (CYMBALTA) 60 MG capsule Take 1 capsule by mouth daily.  Marland Kitchen EPINEPHrine 0.3 mg/0.3 mL IJ SOAJ injection Inject 0.3 mLs (0.3 mg total) into the muscle as needed for anaphylaxis.  . famotidine (PEPCID) 40 MG tablet Take 1 tablet (40 mg total) by mouth at bedtime.  Marland Kitchen  glucosamine-chondroitin 500-400 MG tablet Take 1 tablet by mouth daily.  . Hypertonic Nasal Wash (SINUS RINSE NA) Place into the nose daily.  Marland Kitchen levothyroxine (SYNTHROID, LEVOTHROID) 25 MCG tablet Take 1 tablet by mouth daily.  Marland Kitchen losartan (COZAAR) 25 MG tablet Take 1 tablet by mouth daily.   . montelukast (SINGULAIR) 10 MG tablet TAKE 1 TABLET BY MOUTH EVERYDAY AT BEDTIME  . nystatin (MYCOSTATIN/NYSTOP) powder APPLY TWICE DAILY AS NEEDED TO AFFECTED AREAS  . nystatin-triamcinolone ointment (MYCOLOG) APPLY TO AFFECTED AREA TWICE DAILY AS NEEDED  . omeprazole (PRILOSEC) 20 MG capsule TAKE TWO CAPSULES BY MOUTH EVERY MORNING  . Respiratory Therapy Supplies (FLUTTER) DEVI Use as directed  . traZODone (DESYREL) 100 MG tablet Take 1 tablet by mouth at bedtime.                          Objective:   Physical Exam     amb obese wf nad   01/30/2020         195 12/18/2019           192  11/08/2019         189  08/08/2019      191  07/03/2019        188  06/15/2018        180   05/04/18 183 lb 9.6 oz (83.3 kg)  03/31/18 189 lb 12.8 oz (86.1 kg)  03/11/18 193 lb (87.5 kg)        Vital signs reviewed  01/30/2020  - Note at rest 02 sats  100% on RA     HEENT : pt wearing mask not removed for exam due to covid - 19 concerns.   NECK :  without JVD/Nodes/TM/ nl carotid upstrokes bilaterally   LUNGS: no acc muscle use,  Min barrel  contour chest wall with bilateral  slightly decreased bs s audible wheeze and  without cough on insp or exp maneuvers and min  Hyperresonant  to  percussion bilaterally     CV:  RRR  no s3 or murmur or increase in P2, and no edema   ABD:  Obese/soft and nontender with pos end  insp Hoover's  in the supine position. No bruits or organomegaly appreciated, bowel sounds nl  MS:   Nl gait/  ext warm without deformities, calf tenderness, cyanosis or clubbing No obvious joint restrictions   SKIN: warm and dry without lesions    NEURO:  alert, approp, nl sensorium  with  no motor or cerebellar deficits apparent.                            Assessment:

## 2020-01-31 ENCOUNTER — Encounter: Payer: Self-pay | Admitting: Internal Medicine

## 2020-01-31 NOTE — Assessment & Plan Note (Signed)
Onset of symptoms late 50s dx by [facility] 03/11/2018   rec try symbicort 80 2bid and stop fosfamax - Quant Ig's  05/19/18 nl  - Alpha One AT screen 05/19/18  MM/ level 144  - Allergy profile 05/04/2018 >  Eos 0.5/  IgE 159   RAST  Pos ragweed/grass/ dust/trees/dog/ dust  - 05/04/2018     > continue symb 80 2bid  -HRCT chest 05/2018 >scattered bronchiectasis , scarring  - PFT's  06/15/2018  FEV1 1.46 (84 % ) ratio 75  p 0 % improvement from saba p symb 80 x 2  prior to study with DLCO  89/89 % corrects to 100  % for alv volume   -  Kozlow eval  09/12/18 rec consider biologics  - 07/03/2019  After extensive coaching inhaler device,  effectiveness =    75% > resume symbicort and d/c all others x for alb prn  - 08/08/2019  After extensive coaching inhaler device,  effectiveness =    75% (short Ti) > try off > worse as of 12/18/2019 rec restart symb 160 2bid  -  01/30/2020  After extensive coaching inhaler device,  effectiveness =   75% (short Ti)  Adequate control on present rx, reviewed in detail with pt > no change in rx needed  = symbicort 160 2bid/ keep working on hfa optimal technique and f/u with Dr [name] planned           Each maintenance medication was reviewed in detail including emphasizing most importantly the difference between maintenance and prns and under what circumstances the prns are to be triggered using an action plan format where appropriate.  Total time for H and P, chart review, counseling, teaching device and generating customized AVS unique to this office visit / charting = 20 min

## 2020-02-27 ENCOUNTER — Other Ambulatory Visit: Payer: Self-pay

## 2020-04-12 DIAGNOSIS — J4550 Severe persistent asthma, uncomplicated: Secondary | ICD-10-CM | POA: Diagnosis not present

## 2020-04-15 ENCOUNTER — Other Ambulatory Visit: Payer: Self-pay

## 2020-04-15 ENCOUNTER — Ambulatory Visit (INDEPENDENT_AMBULATORY_CARE_PROVIDER_SITE_OTHER): Payer: Medicare Other

## 2020-04-15 DIAGNOSIS — J4550 Severe persistent asthma, uncomplicated: Secondary | ICD-10-CM | POA: Diagnosis not present

## 2020-04-15 MED ORDER — BENRALIZUMAB 30 MG/ML ~~LOC~~ SOSY
30.0000 mg | PREFILLED_SYRINGE | SUBCUTANEOUS | Status: AC
  Administered 2020-04-15 – 2020-10-04 (×4): 30 mg via SUBCUTANEOUS

## 2020-04-23 ENCOUNTER — Ambulatory Visit: Payer: Self-pay

## 2020-04-30 ENCOUNTER — Other Ambulatory Visit: Payer: Self-pay | Admitting: Allergy and Immunology

## 2020-05-01 ENCOUNTER — Other Ambulatory Visit: Payer: Self-pay | Admitting: Allergy and Immunology

## 2020-05-23 ENCOUNTER — Other Ambulatory Visit: Payer: Self-pay

## 2020-05-23 ENCOUNTER — Emergency Department (INDEPENDENT_AMBULATORY_CARE_PROVIDER_SITE_OTHER)
Admission: EM | Admit: 2020-05-23 | Discharge: 2020-05-23 | Disposition: A | Discharge status: Home / Self Care | Payer: Medicare Other | Source: Home / Self Care

## 2020-05-23 ENCOUNTER — Emergency Department (INDEPENDENT_AMBULATORY_CARE_PROVIDER_SITE_OTHER): Payer: Medicare Other

## 2020-05-23 ENCOUNTER — Encounter: Payer: Self-pay | Admitting: Emergency Medicine

## 2020-05-23 DIAGNOSIS — M25562 Pain in left knee: Secondary | ICD-10-CM

## 2020-05-23 DIAGNOSIS — S80212A Abrasion, left knee, initial encounter: Secondary | ICD-10-CM

## 2020-05-23 DIAGNOSIS — W19XXXA Unspecified fall, initial encounter: Secondary | ICD-10-CM

## 2020-05-23 NOTE — ED Triage Notes (Signed)
Left knee pain w/ laceration on Sunday when she fell onto left knee Increased bleeding this am- resolved now No ice or heat or elevation since Sunday ROM intact  C/O pain - sore  - No OTC meds COVID vaccine [employer]

## 2020-05-23 NOTE — Discharge Instructions (Signed)
  Change your bandage 2-3 times daily. If you are at home, you can leave the bandage off if you prefer.   Call Sports Medicine to schedule a follow up appointment next week to discuss joint pain.

## 2020-05-23 NOTE — ED Provider Notes (Signed)
[facility]    CSN: [id number] Arrival date & time: 05/23/20  1739      History   Chief Complaint Chief Complaint  Patient presents with  . Knee Pain    left  . Knee Injury    left    HPI SHELBEY SPINDLER is a 74 y.o. female.   HPI COLLENE MASSIMINO is a 74 y.o. female presenting to UC with c/o Left knee pain and bleeding after fall this weekend. Bleeding had resolved but started bleeding through her bandage this morning. Bleeding has since stopped but she still has soreness in her knee. Wants to make sure she is okay. Hx of arthritis. She f/u with her PCP, starts PT on Monday, does not have an orthopedist.    Past Medical History:  Diagnosis Date  . Asthma   . Bronchiectasis ([place])   . Fatty liver   . Hyperlipidemia   . Hypertension   . OSA (obstructive sleep apnea)   . Ulcerative colitis [facility])     Patient Active Problem List   Diagnosis Date Noted  . DOE (dyspnea on exertion) 12/18/2019  . Prediabetes 07/19/2019  . Perennial allergic rhinitis 08/09/2018  . Deviated septum 06/17/2018  . Former smoker 06/03/2018  . Chronic sinusitis with polyps 05/28/2018  . Cough 05/05/2018  . Diarrhea in adult patient 04/07/2018  . Thrush 03/31/2018  . Obstructive bronchiectasis ([place]) 03/11/2018  . Hypercholesterolemia 09/21/2017  . Gastroesophageal reflux disease 09/21/2017  . Pseudoexfoliation (PXF) of both lens capsules 07/28/2017  . Nuclear sclerosis of both eyes 07/28/2017  . BMI 40.0-44.9, adult ([place]) 01/15/2017  . Obesity, Class III, BMI 40-49.9 (morbid obesity) ([place]) 01/15/2017  . Postural tremor 11/26/2016  . Alcohol dependence in remission ([place]) 09/05/2015  . Attention deficit hyperactivity disorder (ADHD), predominantly inattentive type 09/05/2015  . Anxiety 06/11/2015  . History of alcohol dependence ([place]) 02/21/2015  . Mild intermittent asthma, uncomplicated 47/82/9562  . Acquired hypothyroidism 07/09/2014  . Persistent depressive disorder 07/09/2014    . H/O ulcerative colitis 07/09/2014  . Nephrolithiasis 07/09/2014  . OSA (obstructive sleep apnea) 07/09/2014  . Plantar fasciitis, bilateral 07/09/2014  . Polycythemia 07/09/2014  . Senile osteoporosis 07/09/2014  . Mixed anxiety and depressive disorder 03/04/2007  . Essential hypertension 03/04/2007  . NAFLD (nonalcoholic fatty liver disease) 03/04/2007  . Osteopenia 03/04/2007  . Ulcerative colitis ([place]) 03/04/2007    Past Surgical History:  Procedure Laterality Date  . ABDOMINAL HERNIA REPAIR    . ABDOMINAL PERINEAL BOWEL RESECTION    . CESAREAN SECTION    . TONSILLECTOMY    . UTERINE FIBROID SURGERY      OB History   No obstetric history on file.      Home Medications    Prior to Admission medications   Medication Sig Start Date End Date Taking? Authorizing Provider  aspirin 81 MG chewable tablet Chew 1 tablet by mouth daily.   Yes [provider]  atorvastatin (LIPITOR) 40 MG tablet Take 20 tablets by mouth daily.  11/01/14  Yes [provider]  buPROPion (WELLBUTRIN XL) 150 MG 24 hr tablet Take 150 mg by mouth daily.   Yes [provider]  clonazePAM (KLONOPIN) 0.5 MG tablet Take 1 tablet by mouth daily as needed. 07/15/15  Yes [provider]  DULoxetine (CYMBALTA) 60 MG capsule Take 1 capsule by mouth daily. 02/14/15  Yes [provider]  levothyroxine (SYNTHROID, LEVOTHROID) 25 MCG tablet Take 1 tablet by mouth daily. 07/15/15  Yes [provider]  losartan (COZAAR) 25 MG tablet Take 1 tablet by mouth daily.  05/06/16  Yes [provider]  montelukast (SINGULAIR) 10 MG tablet TAKE ONE TABLET BY MOUTH EVERY NIGHT AT BEDTIME 05/01/20  Yes Kozlow, Donnamarie Poag, MD  omeprazole (PRILOSEC) 20 MG capsule TAKE TWO CAPSULES BY MOUTH EVERY MORNING 01/04/20  Yes Kozlow, Donnamarie Poag, MD  traZODone (DESYREL) 100 MG tablet Take 1 tablet by mouth at bedtime. 02/14/15  Yes [provider]  albuterol (VENTOLIN HFA) 108 (90  Base) MCG/ACT inhaler Inhale two puffs every four to six hours as needed for cough or wheeze. 12/18/19   Tanda Rockers, MD  Biotin 1000 MCG tablet Take 1 tablet by mouth daily.    [provider]  budesonide-formoterol (SYMBICORT) 160-4.5 MCG/ACT inhaler Inhale 2 puffs into the lungs twice daily with spacer. Rinse, gargle and spit after use. 10/25/19   Kozlow, Donnamarie Poag, MD  Cholecalciferol (VITAMIN D3 PO) Take 2,000 Units by mouth daily.    [provider]  EPINEPHrine 0.3 mg/0.3 mL IJ SOAJ injection Inject 0.3 mLs (0.3 mg total) into the muscle as needed for anaphylaxis. 09/05/19   Kozlow, Donnamarie Poag, MD  famotidine (PEPCID) 40 MG tablet TAKE ONE TABLET BY MOUTH EVERY NIGHT AT BEDTIME 05/01/20   Kozlow, Donnamarie Poag, MD  glucosamine-chondroitin 500-400 MG tablet Take 1 tablet by mouth daily.    [provider]  Hypertonic Nasal Wash (SINUS RINSE NA) Place into the nose daily.    [provider]  nystatin (MYCOSTATIN/NYSTOP) powder APPLY TWICE DAILY AS NEEDED TO AFFECTED AREAS 09/07/18   [provider]  nystatin-triamcinolone ointment (MYCOLOG) APPLY TO AFFECTED AREA TWICE DAILY AS NEEDED 06/16/18   [provider]  Respiratory Therapy Supplies (FLUTTER) DEVI Use as directed 03/31/18   Lauraine Rinne, NP    Family History Family History  Problem Relation Age of Onset  . COPD Mother   . Heart attack Mother   . Hodgkin's lymphoma Father   . Alcoholism Brother   . Cancer Brother     Social History Social History   Tobacco Use  . Smoking status: Former Smoker    Packs/day: 1.50    Years: 17.00    Pack years: 25.50    Types: Cigarettes    Quit date: 10/19/1984    Years since quitting: 35.6  . Smokeless tobacco: Never Used  Substance Use Topics  . Alcohol use: Not Currently  . Drug use: Never     Allergies   Olsalazine and Sulfamethoxazole   Review of Systems Review of Systems  Musculoskeletal: Positive for arthralgias. Negative for joint  swelling.  Skin: Positive for wound. Negative for color change.     Physical Exam Triage Vital Signs ED Triage Vitals  Enc Vitals Group     BP 05/23/20 1814 118/82     Pulse Rate 05/23/20 1814 87     Resp 05/23/20 1814 18     Temp 05/23/20 1814 99.2 F (37.3 C)     Temp Source 05/23/20 1814 Oral     SpO2 05/23/20 1814 96 %     Weight --      Height --      Head Circumference --      Peak Flow --      Pain Score 05/23/20 1815 3     Pain Loc --      Pain Edu? --      Excl. in [place]? --    No data found.  Updated Vital Signs BP 118/82 (BP Location: Right Arm)   Pulse 87   Temp 99.2 F (37.3 C) (Oral)   Resp 18   SpO2 96%   Visual Acuity Right Eye Distance:   Left Eye Distance:   Bilateral Distance:    Right Eye Near:   Left Eye Near:    Bilateral Near:     Physical Exam Vitals and nursing note reviewed.  Constitutional:      Appearance: She is well-developed.  HENT:     Head: Normocephalic and atraumatic.  Cardiovascular:     Rate and Rhythm: Normal rate.  Pulmonary:     Effort: Pulmonary effort is normal.  Musculoskeletal:        General: Tenderness present. No swelling. Normal range of motion.     Cervical back: Normal range of motion.     Comments: Left knee: no edema, full ROM. Tenderness to anterior aspect.  Calf is soft, non-tender.  Skin:    General: Skin is warm and dry.     Comments: Left anterior knee: 4cm superficial abrasion, granulation tissue. No active bleeding. No erythema.   Neurological:     Mental Status: She is alert and oriented to person, place, and time.  Psychiatric:        Behavior: Behavior normal.      UC Treatments / Results  Labs (all labs ordered are listed, but only abnormal results are displayed) Labs Reviewed - No data to display  EKG   Radiology DG Knee Complete 4 Views Left  Result Date: 05/23/2020 CLINICAL DATA:  Left knee pain history of fall EXAM: LEFT KNEE - COMPLETE 4+ VIEW COMPARISON:  None. FINDINGS:  No fracture or malalignment. Trace knee effusion. Joint spaces are maintained IMPRESSION: No acute osseous abnormality. Electronically Signed   By: Donavan Foil M.D.   On: 05/23/2020 19:14    Procedures Procedures (including critical care time)  Medications Ordered in UC Medications - No data to display  Initial Impression / Assessment and Plan / UC Course  I have reviewed the triage vital signs and the nursing notes.  Pertinent labs & imaging results that were available during my care of the patient were reviewed by me and considered in my medical decision making (see chart for details).     Reassured pt wound appears to be healing well  Encouraged to change bandage 2-3 time daily F/u with Sports Medicine for knee pain  AVS given  Final Clinical Impressions(s) / UC Diagnoses   Final diagnoses:  Acute pain of left knee  Abrasion, left knee, initial encounter     Discharge Instructions      Change your bandage 2-3 times daily. If you are at home, you can leave the bandage off if you prefer.   Call Sports Medicine to schedule a follow up appointment next week to discuss joint pain.     ED Prescriptions    None     PDMP not reviewed this encounter.   Noe Gens, [place] 05/25/20 [phone]

## 2020-06-10 DIAGNOSIS — J4550 Severe persistent asthma, uncomplicated: Secondary | ICD-10-CM | POA: Diagnosis not present

## 2020-06-11 ENCOUNTER — Other Ambulatory Visit: Payer: Self-pay

## 2020-06-11 ENCOUNTER — Ambulatory Visit (INDEPENDENT_AMBULATORY_CARE_PROVIDER_SITE_OTHER): Payer: Medicare Other

## 2020-06-11 DIAGNOSIS — J4550 Severe persistent asthma, uncomplicated: Secondary | ICD-10-CM

## 2020-08-06 ENCOUNTER — Ambulatory Visit: Payer: Self-pay

## 2020-08-08 DIAGNOSIS — J4550 Severe persistent asthma, uncomplicated: Secondary | ICD-10-CM | POA: Diagnosis not present

## 2020-08-09 ENCOUNTER — Ambulatory Visit (INDEPENDENT_AMBULATORY_CARE_PROVIDER_SITE_OTHER): Payer: Medicare Other | Admitting: *Deleted

## 2020-08-09 ENCOUNTER — Other Ambulatory Visit: Payer: Self-pay

## 2020-08-09 DIAGNOSIS — J4550 Severe persistent asthma, uncomplicated: Secondary | ICD-10-CM

## 2020-09-06 ENCOUNTER — Other Ambulatory Visit: Payer: Self-pay | Admitting: Allergy and Immunology

## 2020-09-26 ENCOUNTER — Other Ambulatory Visit: Payer: Self-pay | Admitting: Allergy and Immunology

## 2020-10-03 DIAGNOSIS — J4550 Severe persistent asthma, uncomplicated: Secondary | ICD-10-CM | POA: Diagnosis not present

## 2020-10-04 ENCOUNTER — Ambulatory Visit (INDEPENDENT_AMBULATORY_CARE_PROVIDER_SITE_OTHER): Payer: Medicare Other | Admitting: *Deleted

## 2020-10-04 ENCOUNTER — Other Ambulatory Visit: Payer: Self-pay

## 2020-10-04 ENCOUNTER — Telehealth: Payer: Self-pay | Admitting: *Deleted

## 2020-10-04 DIAGNOSIS — J4550 Severe persistent asthma, uncomplicated: Secondary | ICD-10-CM | POA: Diagnosis not present

## 2020-10-04 NOTE — Telephone Encounter (Signed)
Patient came in for her Harrington Challenger injection and stated that she [place] be [place] around January 6th and wanted to know what to do in regards to her next injection which is due in February. I advised to establish care with a provider in that state and contact us to have her medical records faxed over so she could set up her injections there. Her next appointment for her [place] Challenger has not been scheduled. Patient is aware and verbalized understanding.

## 2020-10-07 NOTE — Telephone Encounter (Signed)
Patient has been buy and bill so she will find provider to do same or get her drug

## 2021-02-24 IMAGING — DX DG KNEE COMPLETE 4+V*L*
4 series · 4 of 4 positions shown · non-contrast
Comparison: None.

CLINICAL DATA: Left knee pain history of fall

EXAM:
LEFT KNEE - COMPLETE 4+ VIEW

[knee ap]
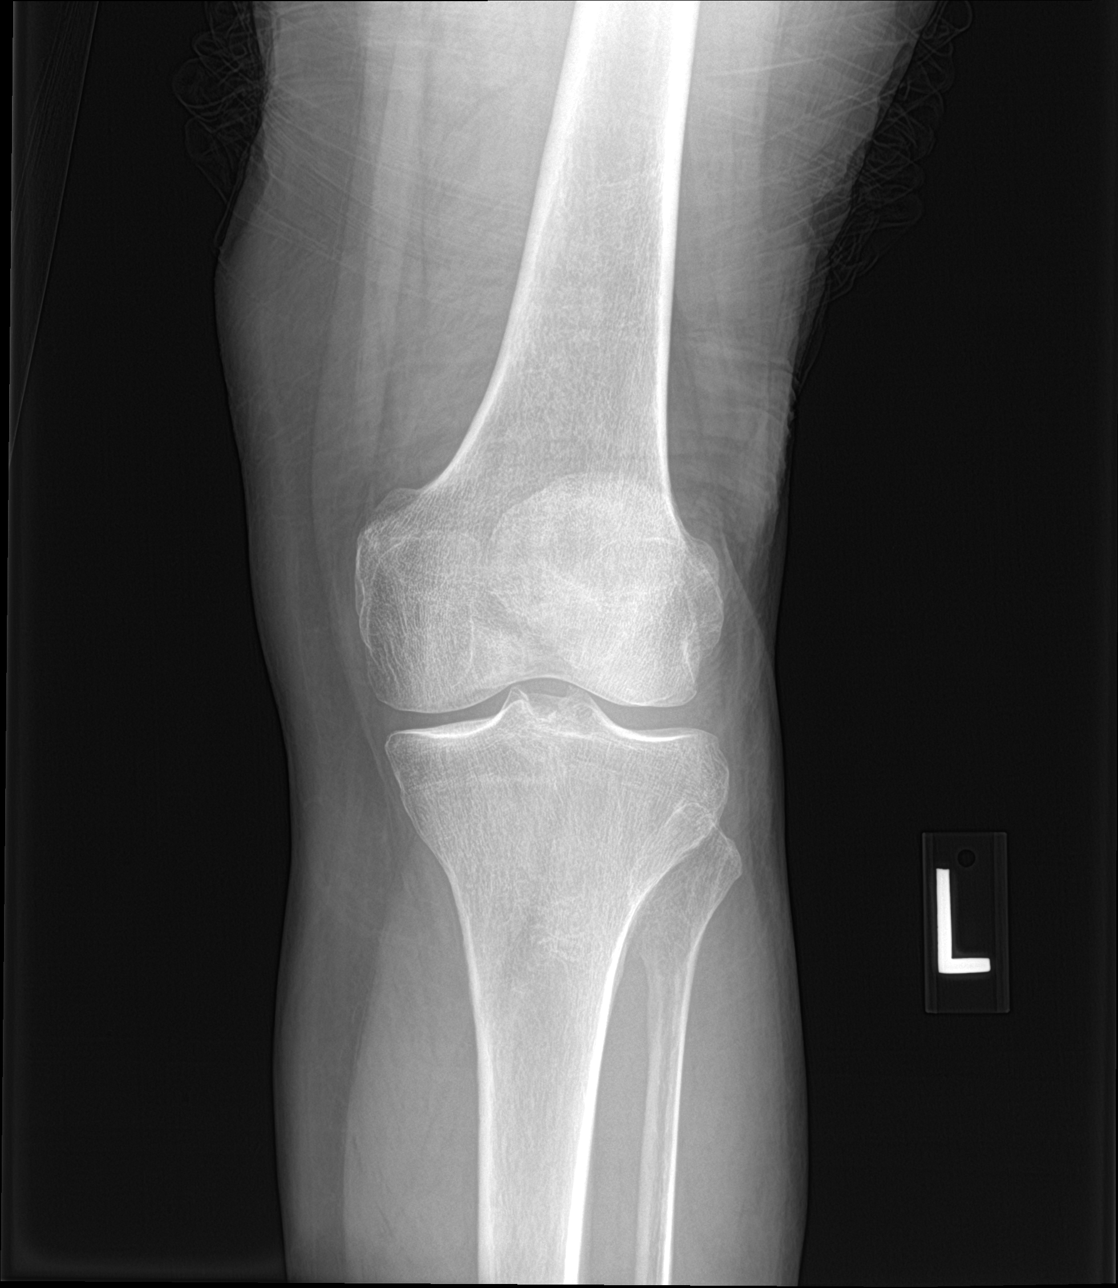

[knee lat]
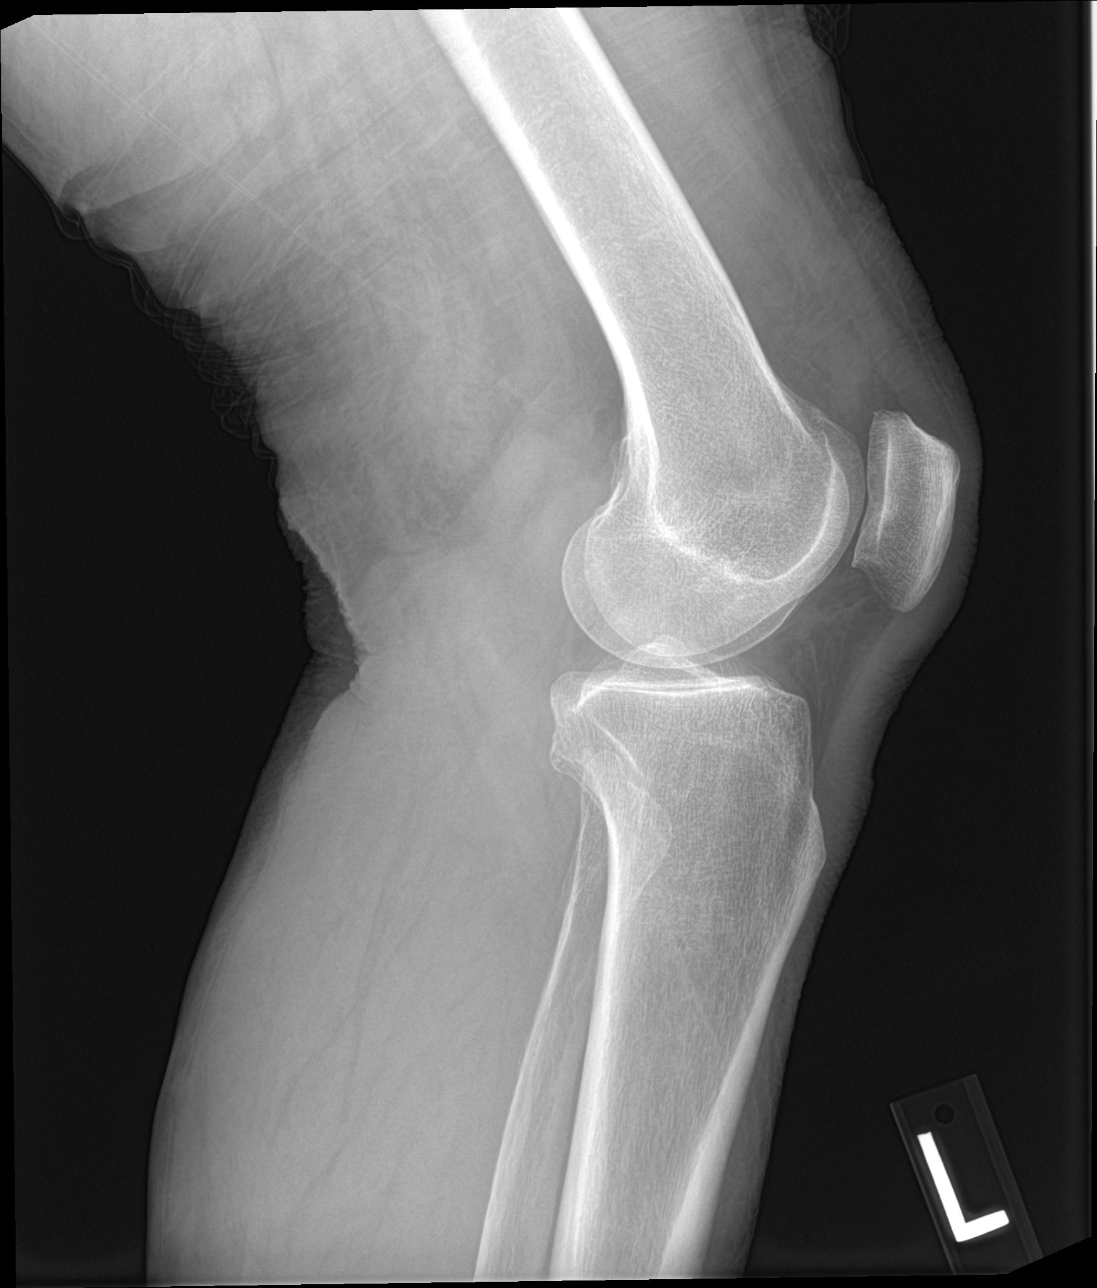

[knee obl (1 of 2)]
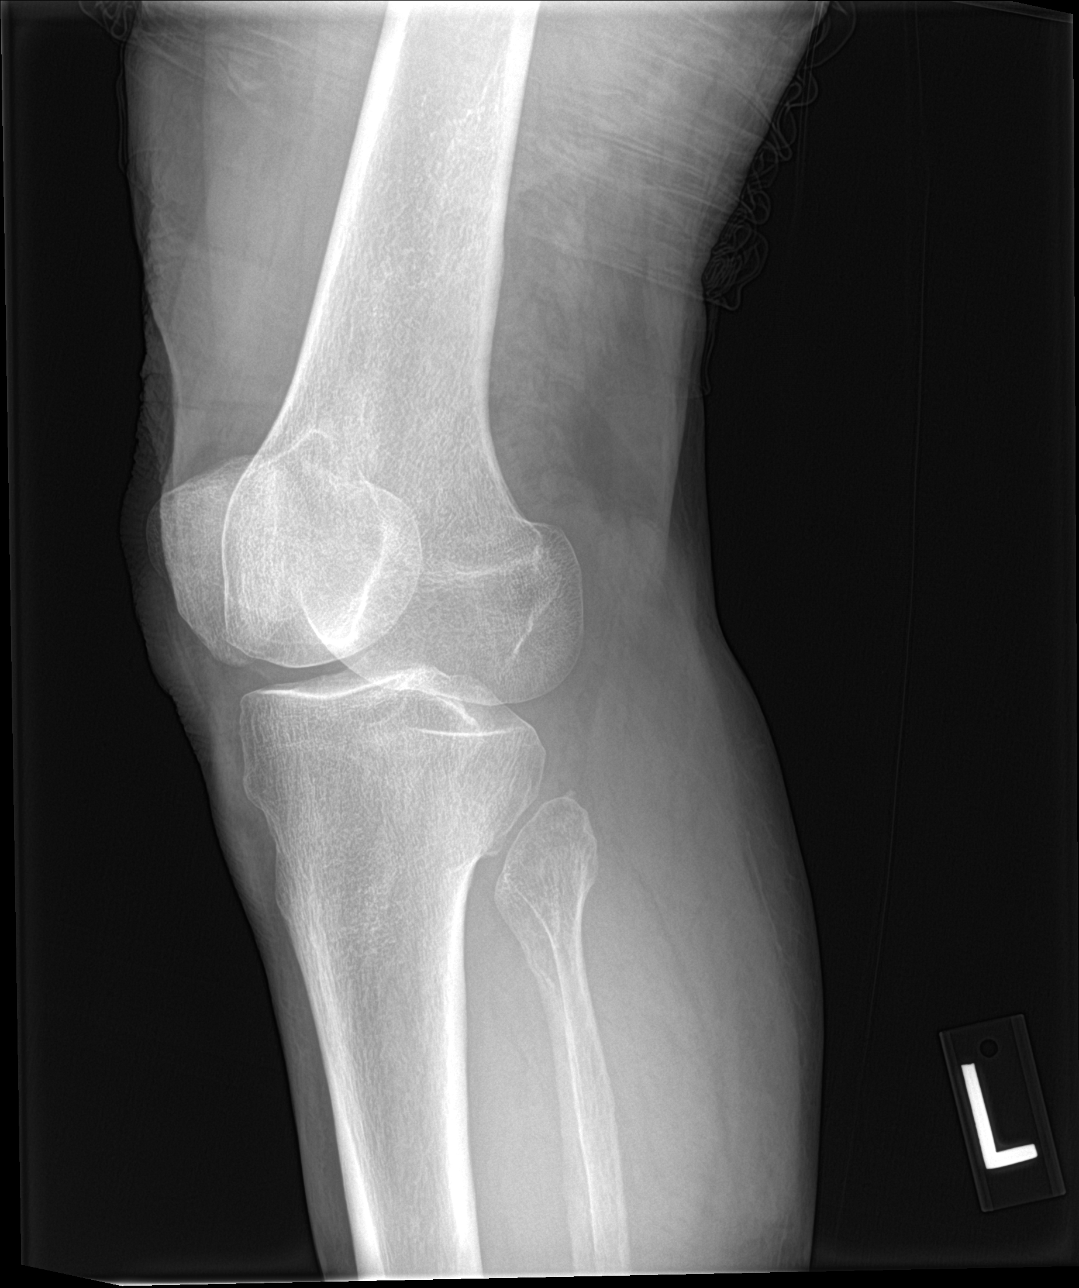

[knee obl (2 of 2)]
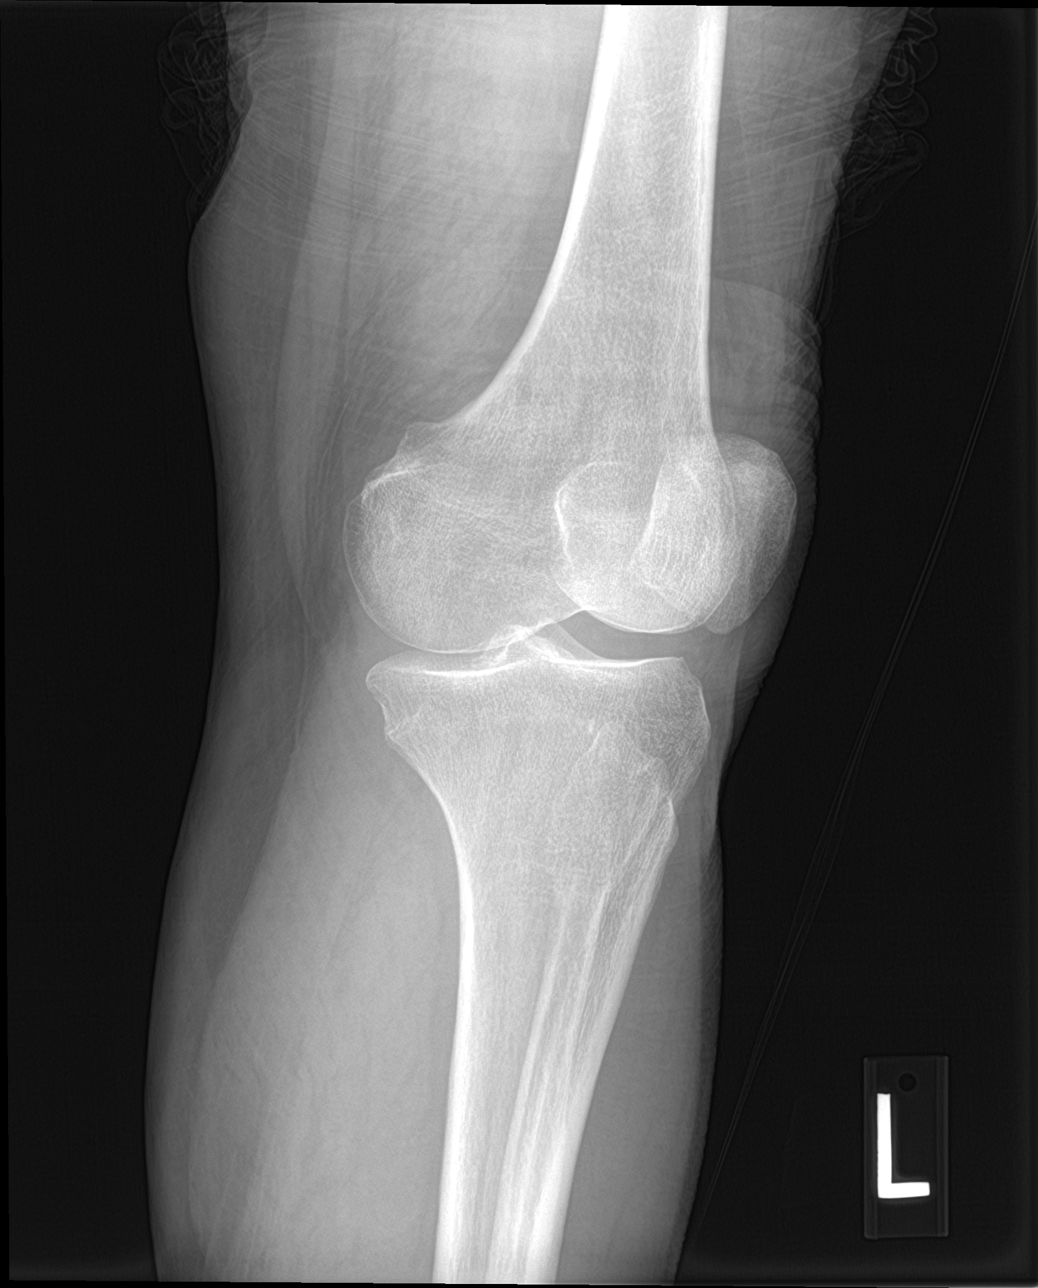

[4 of 4 positions shown; findings below may reference images not displayed]

FINDINGS: No fracture or malalignment. Trace knee effusion. Joint spaces are
maintained
IMPRESSION: No acute osseous abnormality.
